# Patient Record
Sex: Female | Born: 1965 | Race: White | Hispanic: No | Marital: Married | Smoking: Never smoker
Health system: Southern US, Community
[De-identification: ages and names within clinical notes are randomized; demographics above are authoritative.]

## PROBLEM LIST (undated history)

## (undated) DIAGNOSIS — K589 Irritable bowel syndrome without diarrhea: Secondary | ICD-10-CM

## (undated) DIAGNOSIS — M720 Palmar fascial fibromatosis [Dupuytren]: Secondary | ICD-10-CM

## (undated) DIAGNOSIS — K219 Gastro-esophageal reflux disease without esophagitis: Secondary | ICD-10-CM

## (undated) HISTORY — PX: DILATION AND CURETTAGE OF UTERUS: SHX78

## (undated) HISTORY — DX: Gastro-esophageal reflux disease without esophagitis: K21.9

## (undated) HISTORY — PX: COLONOSCOPY: SHX174

## (undated) HISTORY — PX: BREAST BIOPSY: SHX20

## (undated) HISTORY — PX: BUNIONECTOMY: SHX129

## (undated) HISTORY — DX: Irritable bowel syndrome, unspecified: K58.9

## (undated) HISTORY — DX: Palmar fascial fibromatosis (dupuytren): M72.0

---

## 1998-08-30 ENCOUNTER — Other Ambulatory Visit: Admission: RE | Admit: 1998-08-30 | Discharge: 1998-08-30 | Payer: Self-pay | Admitting: *Deleted

## 1999-09-07 ENCOUNTER — Other Ambulatory Visit: Admission: RE | Admit: 1999-09-07 | Discharge: 1999-09-07 | Payer: Self-pay | Admitting: *Deleted

## 2001-01-14 ENCOUNTER — Other Ambulatory Visit: Admission: RE | Admit: 2001-01-14 | Discharge: 2001-01-14 | Payer: Self-pay | Admitting: Obstetrics and Gynecology

## 2001-01-20 ENCOUNTER — Ambulatory Visit (HOSPITAL_COMMUNITY): Admission: RE | Admit: 2001-01-20 | Discharge: 2001-01-20 | Payer: Self-pay | Admitting: Obstetrics and Gynecology

## 2001-01-20 ENCOUNTER — Encounter (INDEPENDENT_AMBULATORY_CARE_PROVIDER_SITE_OTHER): Payer: Self-pay | Admitting: Specialist

## 2001-07-17 ENCOUNTER — Other Ambulatory Visit: Admission: RE | Admit: 2001-07-17 | Discharge: 2001-07-17 | Payer: Self-pay | Admitting: Obstetrics and Gynecology

## 2001-07-20 ENCOUNTER — Encounter (INDEPENDENT_AMBULATORY_CARE_PROVIDER_SITE_OTHER): Payer: Self-pay | Admitting: Specialist

## 2001-07-20 ENCOUNTER — Ambulatory Visit (HOSPITAL_COMMUNITY): Admission: AD | Admit: 2001-07-20 | Discharge: 2001-07-20 | Payer: Self-pay | Admitting: Obstetrics and Gynecology

## 2001-07-20 ENCOUNTER — Encounter: Payer: Self-pay | Admitting: *Deleted

## 2002-06-26 ENCOUNTER — Other Ambulatory Visit: Admission: RE | Admit: 2002-06-26 | Discharge: 2002-06-26 | Payer: Self-pay | Admitting: Obstetrics and Gynecology

## 2004-02-08 ENCOUNTER — Other Ambulatory Visit: Admission: RE | Admit: 2004-02-08 | Discharge: 2004-02-08 | Payer: Self-pay | Admitting: *Deleted

## 2004-07-26 ENCOUNTER — Encounter: Payer: Self-pay | Admitting: Internal Medicine

## 2005-08-27 ENCOUNTER — Ambulatory Visit: Payer: Self-pay | Admitting: Internal Medicine

## 2005-09-25 ENCOUNTER — Ambulatory Visit: Payer: Self-pay | Admitting: Internal Medicine

## 2005-10-22 ENCOUNTER — Ambulatory Visit: Payer: Self-pay | Admitting: Internal Medicine

## 2006-05-23 ENCOUNTER — Ambulatory Visit: Payer: Self-pay | Admitting: Internal Medicine

## 2006-05-30 ENCOUNTER — Ambulatory Visit: Payer: Self-pay | Admitting: Internal Medicine

## 2007-12-11 LAB — CONVERTED CEMR LAB: Pap Smear: NORMAL

## 2008-01-26 ENCOUNTER — Ambulatory Visit: Payer: Self-pay | Admitting: Internal Medicine

## 2008-01-27 ENCOUNTER — Ambulatory Visit: Payer: Self-pay | Admitting: Cardiology

## 2008-01-29 ENCOUNTER — Telehealth: Payer: Self-pay | Admitting: Internal Medicine

## 2008-05-20 ENCOUNTER — Ambulatory Visit: Payer: Self-pay | Admitting: Internal Medicine

## 2008-05-20 LAB — CONVERTED CEMR LAB
Albumin: 3.7 g/dL (ref 3.5–5.2)
BUN: 6 mg/dL (ref 6–23)
Bilirubin Urine: NEGATIVE
Calcium: 8.7 mg/dL (ref 8.4–10.5)
Cholesterol: 211 mg/dL (ref 0–200)
Creatinine, Ser: 0.8 mg/dL (ref 0.4–1.2)
Eosinophils Relative: 2.8 % (ref 0.0–5.0)
GFR calc Af Amer: 101 mL/min
Glucose, Bld: 97 mg/dL (ref 70–99)
HCT: 39 % (ref 36.0–46.0)
Hemoglobin: 13.6 g/dL (ref 12.0–15.0)
Monocytes Absolute: 0.5 10*3/uL (ref 0.1–1.0)
Monocytes Relative: 10.4 % (ref 3.0–12.0)
Neutro Abs: 3.1 10*3/uL (ref 1.4–7.7)
Specific Gravity, Urine: 1.02
TSH: 3 microintl units/mL (ref 0.35–5.50)
Total CHOL/HDL Ratio: 4.2
Total Protein: 6.7 g/dL (ref 6.0–8.3)
Urobilinogen, UA: 0.2
WBC: 4.7 10*3/uL (ref 4.5–10.5)

## 2008-05-28 ENCOUNTER — Ambulatory Visit: Payer: Self-pay | Admitting: Internal Medicine

## 2008-06-16 ENCOUNTER — Telehealth: Payer: Self-pay | Admitting: Internal Medicine

## 2008-06-16 ENCOUNTER — Ambulatory Visit: Payer: Self-pay | Admitting: Internal Medicine

## 2008-06-16 DIAGNOSIS — R930 Abnormal findings on diagnostic imaging of skull and head, not elsewhere classified: Secondary | ICD-10-CM

## 2008-06-17 ENCOUNTER — Ambulatory Visit: Payer: Self-pay | Admitting: Internal Medicine

## 2008-06-30 ENCOUNTER — Ambulatory Visit: Payer: Self-pay | Admitting: Internal Medicine

## 2008-10-25 ENCOUNTER — Ambulatory Visit: Payer: Self-pay | Admitting: Internal Medicine

## 2008-10-25 DIAGNOSIS — K589 Irritable bowel syndrome without diarrhea: Secondary | ICD-10-CM

## 2008-11-02 ENCOUNTER — Ambulatory Visit: Payer: Self-pay | Admitting: Internal Medicine

## 2008-11-02 LAB — CONVERTED CEMR LAB
ALT: 16 units/L (ref 0–35)
Albumin: 3.9 g/dL (ref 3.5–5.2)
Alkaline Phosphatase: 42 units/L (ref 39–117)
GFR calc non Af Amer: 98 mL/min
Glucose, Bld: 84 mg/dL (ref 70–99)
HCT: 39.5 % (ref 36.0–46.0)
Lymphocytes Relative: 23.4 % (ref 12.0–46.0)
Monocytes Absolute: 0.6 10*3/uL (ref 0.1–1.0)
Monocytes Relative: 11.9 % (ref 3.0–12.0)
Platelets: 245 10*3/uL (ref 150–400)
Potassium: 3.9 meq/L (ref 3.5–5.1)
RDW: 11.9 % (ref 11.5–14.6)
Sodium: 140 meq/L (ref 135–145)
Total Protein: 7 g/dL (ref 6.0–8.3)

## 2008-11-02 LAB — HM COLONOSCOPY

## 2008-11-11 ENCOUNTER — Ambulatory Visit (HOSPITAL_COMMUNITY): Admission: RE | Admit: 2008-11-11 | Discharge: 2008-11-11 | Payer: Self-pay | Admitting: Internal Medicine

## 2009-02-07 ENCOUNTER — Ambulatory Visit: Payer: Self-pay | Admitting: Internal Medicine

## 2009-02-10 ENCOUNTER — Ambulatory Visit: Payer: Self-pay | Admitting: Internal Medicine

## 2009-02-10 ENCOUNTER — Telehealth (INDEPENDENT_AMBULATORY_CARE_PROVIDER_SITE_OTHER): Payer: Self-pay | Admitting: *Deleted

## 2009-06-01 ENCOUNTER — Encounter: Payer: Self-pay | Admitting: Internal Medicine

## 2009-07-07 IMAGING — US US TRANSVAGINAL NON-OB
1 series · 14 of 25 positions shown · non-contrast
Comparison: None relevant.

Addendum Begins

Impression #2 should read NO suspicious adnexal findings.
Addendum Ends
CLINICAL DATA: Abdominal pelvic bloating, worse with menses.
TRANSABDOMINAL AND TRANSVAGINAL ULTRASOUND OF PELVIS
TECHNIQUE: Both transabdominal and transvaginal ultrasound
examinations of the pelvis were performed including evaluation of
the uterus, ovaries, adnexal regions, and pelvic cul-de-sac.

[Series 1: unknown · 0.32mm/px · 14 of 48 slices shown]
[im 1/48]
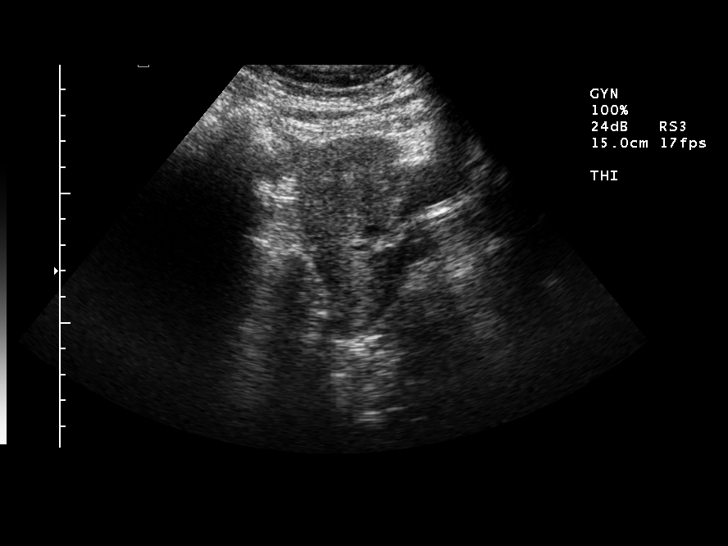
[im 4/48]
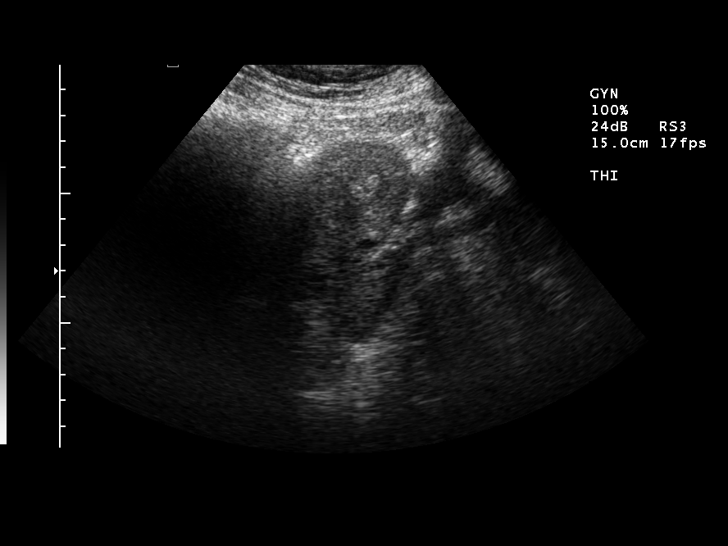
[im 8/48]
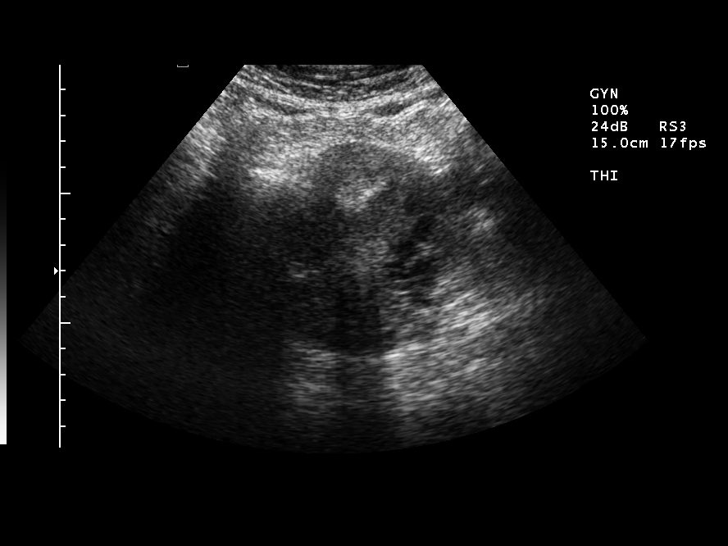
[im 12/48]
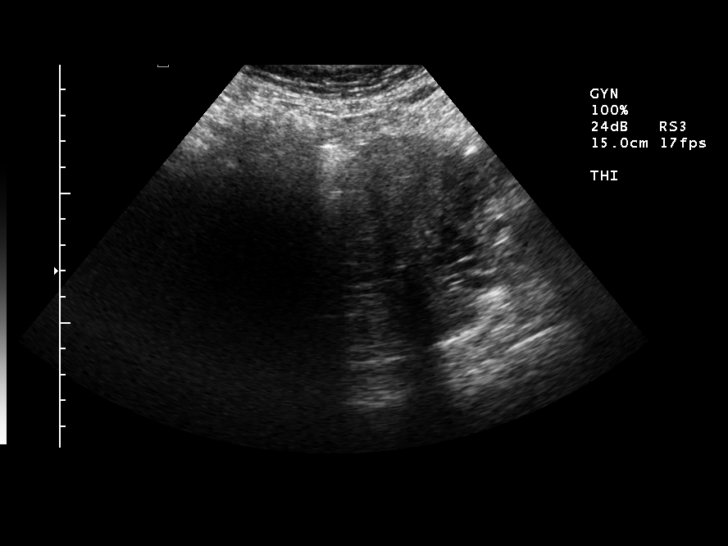
[im 16/48]
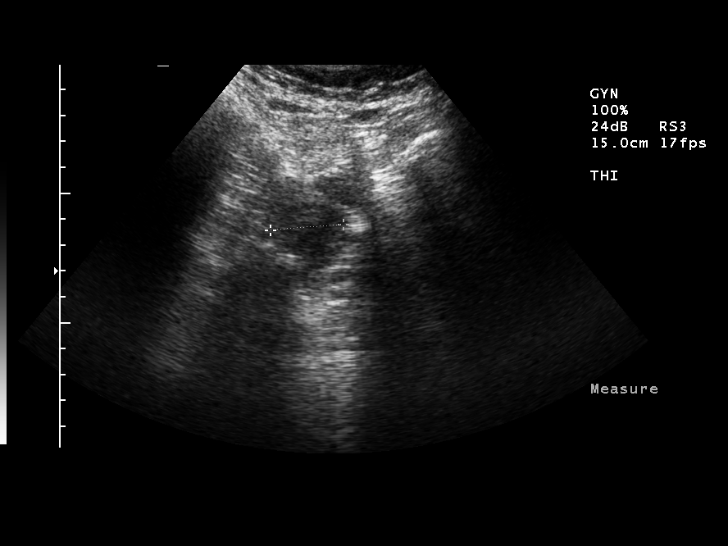
[im 18/48]
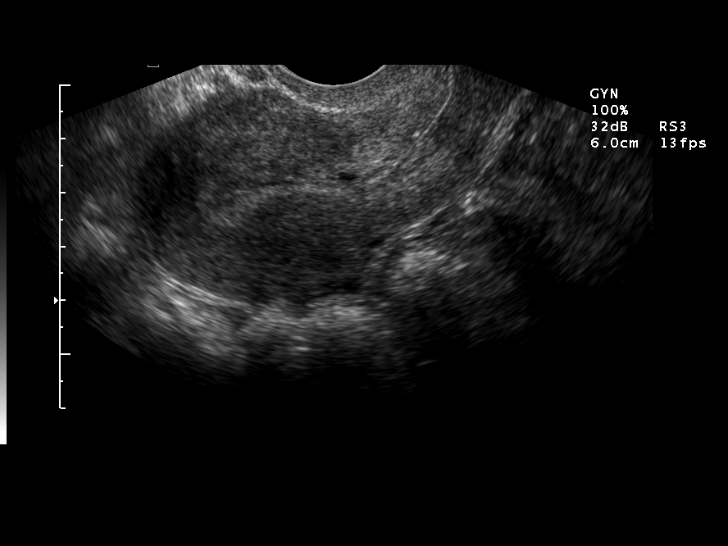
[im 22/48]
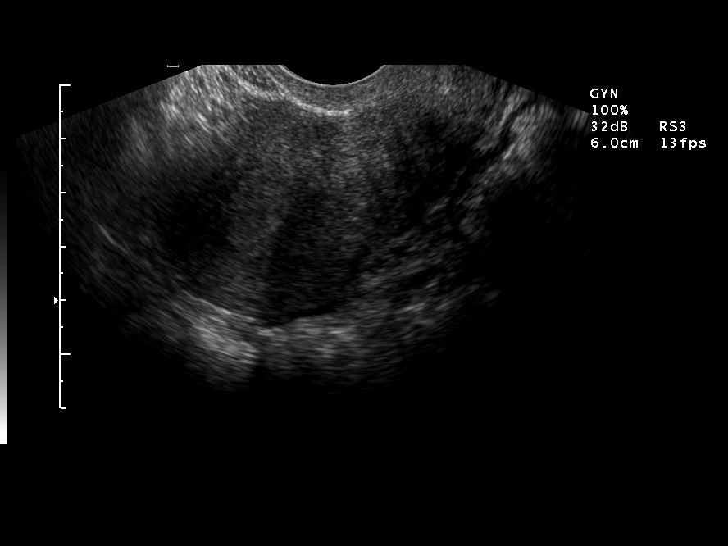
[im 26/48]
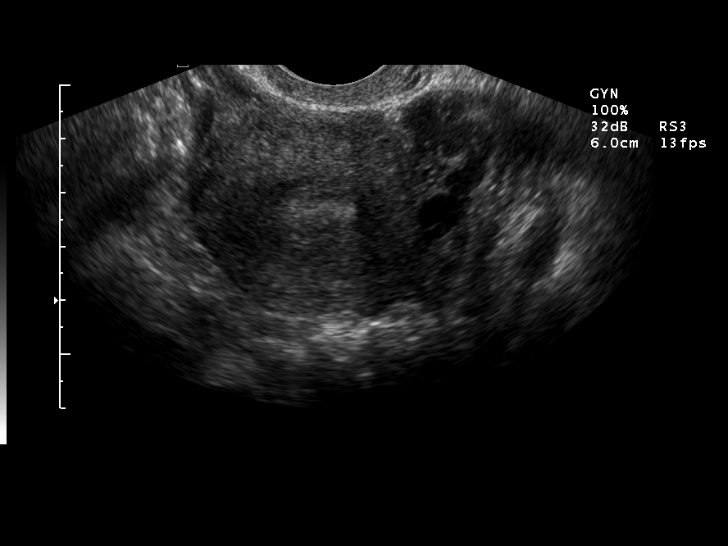
[im 30/48]
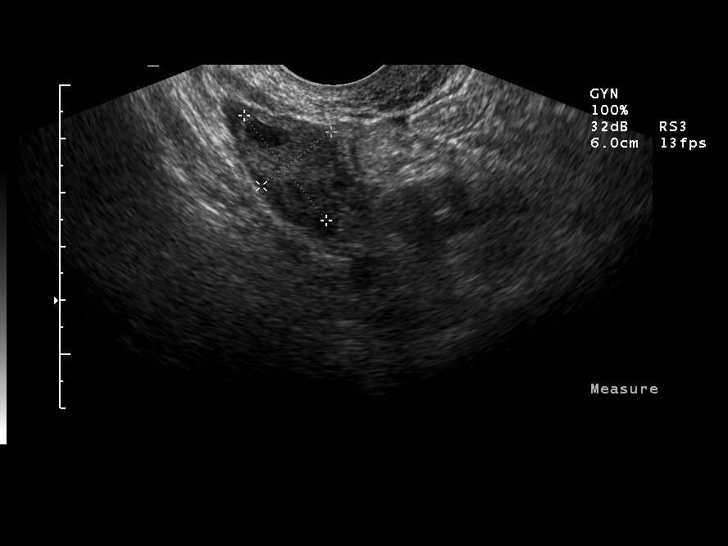
[im 32/48]
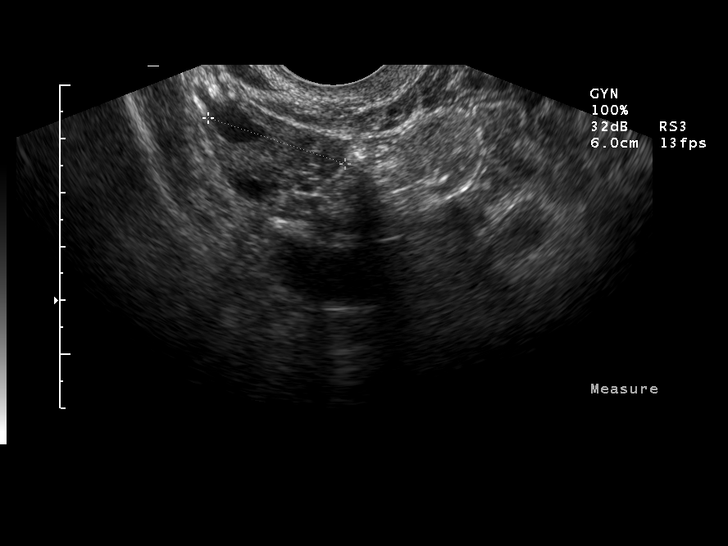
[im 36/48]
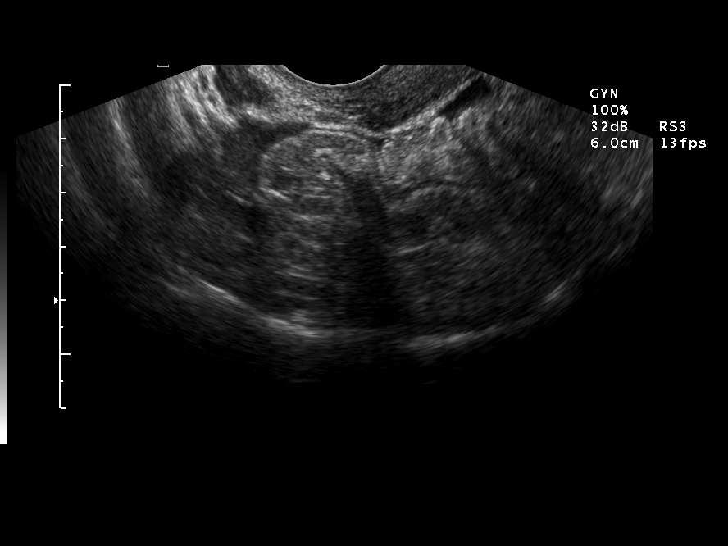
[im 40/48]
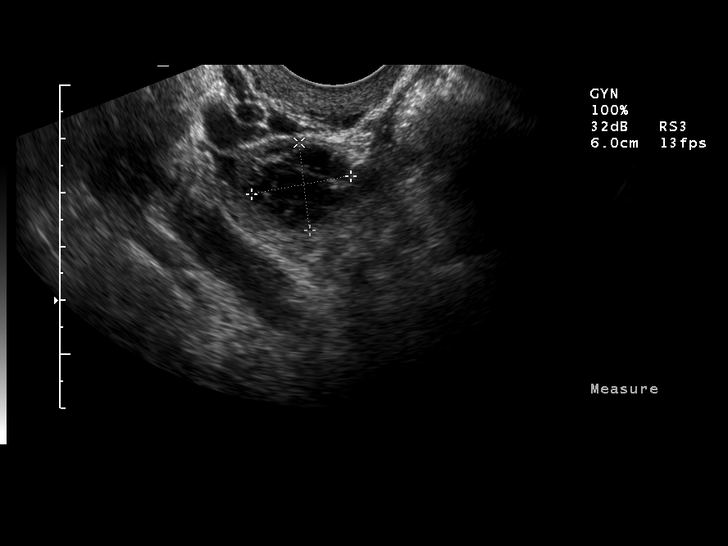
[im 44/48]
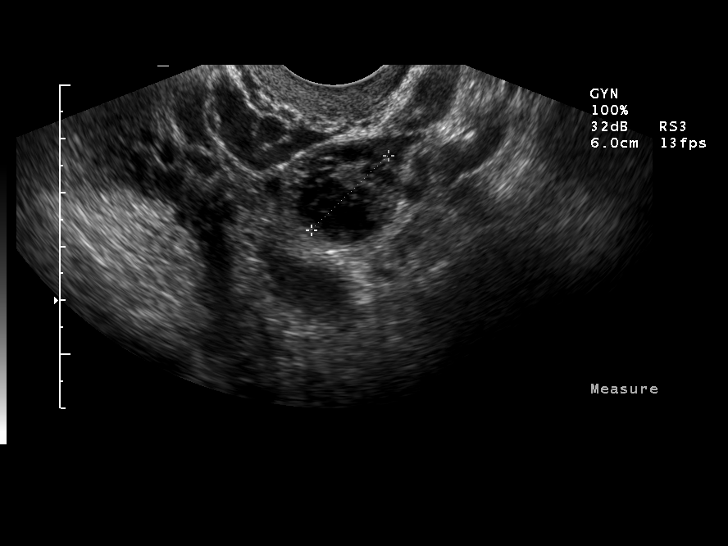
[im 48/48]
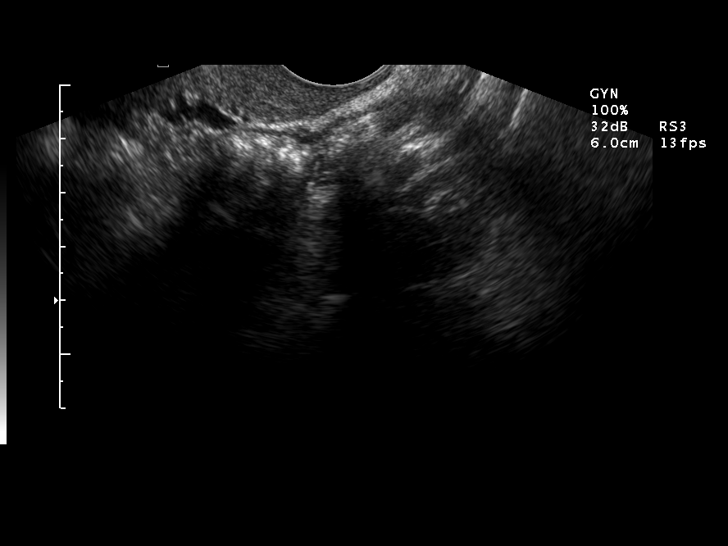

[14 of 25 positions shown; findings below may reference images not displayed]

FINDINGS: The uterus measures 7.8 x 4.3 x 6.4 cm.  The myometrium
is homogeneous.  The endometrium measures 1 cm in thickness.

Both ovaries are normal in size.  The right ovary measures 2.5 x
1.6 x 2.7 cm.  The left ovary measures 2.6 x 2.3 x 3.1 cm.  Within
the left ovary, there is a mildly complex cystic lesion measuring
1.9 x 1.6 x 2.0 cm.  This has an appearance most compatible with a
small hemorrhagic corpus luteal cyst.  There is no free pelvic
fluid.
IMPRESSION: 1.  Probable small left ovarian hemorrhagic corpus luteal cyst
measuring 2.0 cm in greatest dimension.
2.  Suspicious adnexal findings.
3.  The uterus appears normal.

## 2009-08-18 ENCOUNTER — Ambulatory Visit: Payer: Self-pay | Admitting: Internal Medicine

## 2009-08-18 LAB — CONVERTED CEMR LAB
AST: 17 units/L (ref 0–37)
Albumin: 4.1 g/dL (ref 3.5–5.2)
Alkaline Phosphatase: 40 units/L (ref 39–117)
BUN: 10 mg/dL (ref 6–23)
Basophils Absolute: 0 10*3/uL (ref 0.0–0.1)
Bilirubin Urine: NEGATIVE
CO2: 28 meq/L (ref 19–32)
Calcium: 8.8 mg/dL (ref 8.4–10.5)
Cholesterol: 189 mg/dL (ref 0–200)
Eosinophils Absolute: 0.1 10*3/uL (ref 0.0–0.7)
GFR calc non Af Amer: 96.79 mL/min (ref >60)
Glucose, Bld: 90 mg/dL (ref 70–99)
Glucose, Urine, Semiquant: NEGATIVE
HDL: 47.2 mg/dL (ref >39.00)
Ketones, urine, test strip: NEGATIVE
Lymphocytes Relative: 24 % (ref 12.0–46.0)
Lymphs Abs: 1.2 10*3/uL (ref 0.7–4.0)
MCHC: 34.2 g/dL (ref 30.0–36.0)
Monocytes Relative: 9.7 % (ref 3.0–12.0)
Platelets: 228 10*3/uL (ref 150.0–400.0)
Protein, U semiquant: NEGATIVE
RDW: 11.8 % (ref 11.5–14.6)
TSH: 1.72 microintl units/mL (ref 0.35–5.50)
Total Bilirubin: 0.9 mg/dL (ref 0.3–1.2)
Triglycerides: 82 mg/dL (ref 0.0–149.0)
VLDL: 16.4 mg/dL (ref 0.0–40.0)
WBC Urine, dipstick: NEGATIVE
pH: 7

## 2009-08-24 ENCOUNTER — Ambulatory Visit: Payer: Self-pay | Admitting: Internal Medicine

## 2009-09-05 ENCOUNTER — Encounter (INDEPENDENT_AMBULATORY_CARE_PROVIDER_SITE_OTHER): Payer: Self-pay | Admitting: *Deleted

## 2010-06-06 ENCOUNTER — Encounter: Payer: Self-pay | Admitting: Internal Medicine

## 2010-06-29 ENCOUNTER — Ambulatory Visit: Payer: Self-pay | Admitting: Internal Medicine

## 2010-06-29 ENCOUNTER — Telehealth: Payer: Self-pay | Admitting: Internal Medicine

## 2010-06-29 LAB — CONVERTED CEMR LAB
Basophils Relative: 0.3 % (ref 0.0–3.0)
Eosinophils Absolute: 0.1 10*3/uL (ref 0.0–0.7)
Eosinophils Relative: 0.7 % (ref 0.0–5.0)
Hemoglobin: 13.4 g/dL (ref 12.0–15.0)
Lymphocytes Relative: 22.3 % (ref 12.0–46.0)
MCHC: 34.6 g/dL (ref 30.0–36.0)
Neutro Abs: 5.3 10*3/uL (ref 1.4–7.7)
Neutrophils Relative %: 68.7 % (ref 43.0–77.0)
RBC: 4.24 M/uL (ref 3.87–5.11)
WBC: 7.7 10*3/uL (ref 4.5–10.5)

## 2010-08-07 ENCOUNTER — Encounter: Payer: Self-pay | Admitting: Internal Medicine

## 2010-09-05 ENCOUNTER — Ambulatory Visit: Payer: Self-pay | Admitting: Internal Medicine

## 2010-09-05 LAB — CONVERTED CEMR LAB
AST: 17 units/L (ref 0–37)
Alkaline Phosphatase: 41 units/L (ref 39–117)
Basophils Absolute: 0 10*3/uL (ref 0.0–0.1)
Basophils Relative: 0.6 % (ref 0.0–3.0)
Bilirubin Urine: NEGATIVE
Bilirubin, Direct: 0.1 mg/dL (ref 0.0–0.3)
Blood in Urine, dipstick: NEGATIVE
CO2: 29 meq/L (ref 19–32)
Calcium: 8.9 mg/dL (ref 8.4–10.5)
Cholesterol: 221 mg/dL — ABNORMAL HIGH (ref 0–200)
Creatinine, Ser: 0.8 mg/dL (ref 0.4–1.2)
Eosinophils Absolute: 0.1 10*3/uL (ref 0.0–0.7)
GFR calc non Af Amer: 83.78 mL/min (ref >60)
HDL: 51.5 mg/dL (ref >39.00)
Hemoglobin: 13.3 g/dL (ref 12.0–15.0)
Ketones, urine, test strip: NEGATIVE
Lymphocytes Relative: 27.7 % (ref 12.0–46.0)
MCHC: 34.2 g/dL (ref 30.0–36.0)
Monocytes Relative: 11 % (ref 3.0–12.0)
Neutro Abs: 2.9 10*3/uL (ref 1.4–7.7)
Neutrophils Relative %: 57.8 % (ref 43.0–77.0)
Nitrite: NEGATIVE
Protein, U semiquant: NEGATIVE
RBC: 4.24 M/uL (ref 3.87–5.11)
Sodium: 140 meq/L (ref 135–145)
Total CHOL/HDL Ratio: 4
Total Protein: 7.1 g/dL (ref 6.0–8.3)
Triglycerides: 194 mg/dL — ABNORMAL HIGH (ref 0.0–149.0)
Urobilinogen, UA: 0.2
VLDL: 38.8 mg/dL (ref 0.0–40.0)
WBC: 5 10*3/uL (ref 4.5–10.5)

## 2010-09-09 LAB — HM MAMMOGRAPHY: HM Mammogram: NORMAL

## 2010-09-09 LAB — CONVERTED CEMR LAB: Pap Smear: NORMAL

## 2010-09-12 ENCOUNTER — Ambulatory Visit: Payer: Self-pay | Admitting: Internal Medicine

## 2010-09-14 ENCOUNTER — Ambulatory Visit: Payer: Self-pay | Admitting: Internal Medicine

## 2010-09-14 LAB — CONVERTED CEMR LAB: Rhuematoid fact SerPl-aCnc: <20 intl units/mL (ref 0–20)

## 2011-01-07 LAB — CONVERTED CEMR LAB
BUN: 5 mg/dL — ABNORMAL LOW (ref 6–23)
Basophils Absolute: 0 10*3/uL (ref 0.0–0.1)
Calcium: 9 mg/dL (ref 8.4–10.5)
Eosinophils Absolute: 0.1 10*3/uL (ref 0.0–0.6)
Eosinophils Relative: 1.9 % (ref 0.0–5.0)
GFR calc Af Amer: 118 mL/min
GFR calc non Af Amer: 98 mL/min
Glucose, Bld: 102 mg/dL — ABNORMAL HIGH (ref 70–99)
Lymphocytes Relative: 26.5 % (ref 12.0–46.0)
MCHC: 34.1 g/dL (ref 30.0–36.0)
MCV: 89.2 fL (ref 78.0–100.0)
Monocytes Relative: 8.6 % (ref 3.0–11.0)
Neutro Abs: 3.8 10*3/uL (ref 1.4–7.7)
Platelets: 288 10*3/uL (ref 150–400)
TSH: 3.96 microintl units/mL (ref 0.35–5.50)

## 2011-01-09 NOTE — Assessment & Plan Note (Signed)
Summary: abdominal pain, rectal bleeding -rv   Vital Signs:  Patient profile:   45 year old female Weight:      137 pounds BP sitting:   110 / 68  (right arm) Cuff size:   regular  Vitals Entered By: Duard Brady LPN (June 29, 2010 12:47 PM) CC: c/o abd paim , cramps and diarrhea. blood noted in stools   Primary Care Provider:  Dr. Birdie Sons  CC:  c/o abd paim  and cramps and diarrhea. blood noted in stools.  History of Present Illness: 45 year old patient who has a history of IBS.  She also has a history of factor V Leiden abnormality.  Yesterday, she developed some nausea and crampy, abdominal pain.  Last night, she had  an episode of hematochezia associated with some mucous-type discharge.  This occurred after two or 3 diarrheal stools.  Throughout the day she has had small-volume bloody mucoid loose stools.  She continues to have some mild crampy pain and some nausea.  No pertinent travel history, although she does use a community well.  She has constipation prone IBS.  the patient did have a normal colonoscopy in November of 2009  Allergies: 1)  ! Erythromycin Base (Erythromycin Base) 2)  ! * Neumorfen  Past History:  Past Medical History: Reviewed history from 08/24/2009 and no changes required. factor 5 deficiency (miscarriages) GERD Irritable Bowel Syndrome Abn CT chest 01/27/08 = RN changes, mild bronchiectasis LUL     - Baseline CXR 07/12/2004 Subtle RN changes     - Re cxr 02/10/09 no change  Review of Systems       The patient complains of anorexia, abdominal pain, and hematochezia.  The patient denies fever, weight loss, weight gain, vision loss, decreased hearing, hoarseness, chest pain, syncope, dyspnea on exertion, peripheral edema, prolonged cough, headaches, hemoptysis, melena, severe indigestion/heartburn, hematuria, incontinence, genital sores, muscle weakness, suspicious skin lesions, transient blindness, difficulty walking, depression, unusual weight  change, abnormal bleeding, enlarged lymph nodes, angioedema, and breast masses.    Physical Exam  General:  Well-developed,well-nourished,in no acute distress; alert,appropriate and cooperative throughout examination; 120/78 Mouth:  Oral mucosa and oropharynx without lesions or exudates.  Teeth in good repair. Neck:  No deformities, masses, or tenderness noted. Lungs:  Normal respiratory effort, chest expands symmetrically. Lungs are clear to auscultation, no crackles or wheezes. Heart:  Normal rate and regular rhythm. S1 and S2 normal without gallop, murmur, click, rub or other extra sounds.  no tachycardia Abdomen:  active bowel sounds.  No distention or guarding suggestion of some very mild left lower quadrant tenderness Rectal:  no external abnormalities, no hemorrhoids, normal sphincter tone, and no masses.  scanty stool was hematest positive   Impression & Recommendations:  Problem # 1:  HEMATOCHEZIA (ICD-578.1)  possibilities include infectious colitis;  in view of her factor V Leiden deficiency.  Also, ischemic bowel is a consideration; options were discussed.  Her symptoms seem to be improving.  Will check a CBC today and clinically observe off antibiotic therapy.  She will call if there is any clinical worsening such as worsening abdominal pain or bloody diarrhea.  She states as he does not require any analgesics or antiasthmatics  Orders: Venipuncture (51884) TLB-CBC Platelet - w/Differential (85025-[id number]) Specimen Handling (16606)  Complete Medication List: 1)  Aspirin 81 Mg Tabs (Aspirin) .... One daily 2)  Fish Oil 1000 Mg Caps (Omega-3 fatty acids) .... Once daily 3)  Multivitamins Caps (Multiple vitamin) .... Once daily 4)  Calcium  600+d Plus Minerals 600-400 Mg-unit Chew (Calcium carbonate-vit d-min) .... Once daily 5)  Psyllium 58.6 % Powd (Psyllium) .... As directed as needed  Patient Instructions: 1)  call if there is any worsening abdominal pain or bloody  diarrhea 2)  hold  daily, aspirin until bleeding resolves 3)  Drink clear liquids only for the next 24 hours, then slowly add other liquids and food as you  tolerate them.

## 2011-01-09 NOTE — Assessment & Plan Note (Signed)
Summary: CPX // RS   Vital Signs:  Patient profile:   45 year old female Height:      64.75 inches Weight:      139 pounds BMI:     23.39 Temp:     98.8 degrees F oral Pulse rate:   80 / minute Pulse rhythm:   regular Resp:     12 per minute BP sitting:   130 / 90  (left arm) Cuff size:   regular  Vitals Entered By: Sid Falcon LPN (September 12, 2010 8:13 AM)   Primary Care Provider:  Dr. Birdie Sons   History of Present Illness: cpx  Current Problems (verified): 1)  Ibs  (ICD-564.1) 2)  Ct, Chest, Abnormal  (ICD-793.1) 3)  Physical Examination  (ICD-V70.0)  Current Medications (verified): 1)  Aspirin 81 Mg  Tabs (Aspirin) .... One Daily 2)  Fish Oil 1000 Mg Caps (Omega-3 Fatty Acids) .... Once Daily 3)  Multivitamins  Caps (Multiple Vitamin) .... Once Daily 4)  Calcium 600+d Plus Minerals 600-400 Mg-Unit Chew (Calcium Carbonate-Vit D-Min) .... Once Daily 5)  Psyllium 58.6 % Powd (Psyllium) .... As Directed As Needed  Allergies: 1)  ! Erythromycin Base (Erythromycin Base) 2)  ! * Neumorfen  Past History:  Past Medical History: Last updated: 08/24/2009 factor 5 deficiency (miscarriages) GERD Irritable Bowel Syndrome Abn CT chest 01/27/08 = RN changes, mild bronchiectasis LUL     - Baseline CXR 07/12/2004 Subtle RN changes     - Re cxr 02/10/09 no change  Past Surgical History: Last updated: 08/24/2009 breast Bx benign  bunionectomy D and C miscarriage. (2)  Family History: Last updated: 05/26/2008 Father: alive and well with choles, ?DM Mother: HTN, choles, ?DM Siblings: no brothers, 1 sister alive and well Family History Diabetes 1st degree relative Family History High cholesterol Family History Hypertension  Social History: Last updated: 10/25/2008 Occupation:Home maker Married Never Smoked 2 children healthy Alcohol Use - no Daily Caffeine Use -2 Illicit Drug Use - no  Risk Factors: Smoking Status: never (01/26/2008)  Review of  Systems       All other systems reviewed and were negative except new problems of hand pain says hands are achey. Increase pain with gripping. sxs worse in the a.m.---takes an hour until improvement.  left arm---tender and painful over elbow laterally   Impression & Recommendations:  Problem # 1:  PHYSICAL EXAMINATION (ICD-V70.0) health maint UTD lateral epicondylitis hand pain  Complete Medication List: 1)  Aspirin 81 Mg Tabs (Aspirin) .... One daily 2)  Fish Oil 1000 Mg Caps (Omega-3 fatty acids) .... Once daily 3)  Multivitamins Caps (Multiple vitamin) .... Once daily 4)  Calcium 600+d Plus Minerals 600-400 Mg-unit Chew (Calcium carbonate-vit d-min) .... Once daily 5)  Psyllium 58.6 % Powd (Psyllium) .... As directed as needed  Other Orders: Admin 1st Vaccine (16109) Flu Vaccine 52yrs + [phone]) Venipuncture [phone]) T-Antinuclear Antib (ANA) [phone]) T-Rheumatoid Factor [phone]) T-2 View CXR (71020TC)     Flu Vaccine Consent Questions     Do you have a history of severe allergic reactions to this vaccine? no    Any prior history of allergic reactions to egg and/or gelatin? no    Do you have a sensitivity to the preservative Thimersol? no    Do you have a past history of Guillan-Barre Syndrome? no    Do you currently have an acute febrile illness? no    Have you ever had a severe reaction to latex? no  Vaccine information given and explained to patient? yes    Are you currently pregnant? no    Lot Number:[id number]   Exp Date:06/09/2011   Site Given  Left Deltoid IMflu Physical Exam General Appearance: well developed, well nourished, no acute distress Eyes: conjunctiva and lids normal, PERRL, EOMI,  Ears, Nose, Mouth, Throat: TM clear, nares clear, oral exam WNL Neck: supple, no lymphadenopathy, no thyromegaly, no JVD Respiratory: clear to auscultation and percussion, respiratory effort normal Cardiovascular: regular rate and rhythm, S1-S2, no murmur, rub  or gallop, no bruits, peripheral pulses normal and symmetric, no cyanosis, clubbing, edema or varicosities Chest: no scars, masses, tenderness; no asymmetry, skin changes, nipple discharge   Gastrointestinal: soft, non-tender; no hepatosplenomegaly, masses; active bowel sounds all quadrants, Lymphatic: no cervical, axillary or inguinal adenopathy Musculoskeletal: gait normal, muscle tone and strength WNL, no joint swelling, effusions, discoloration, crepitus  Skin: clear, good turgor, color WNL, no rashes, lesions, or ulcerations Neurologic: normal mental status, normal reflexes, normal strength, sensation, and motion Psychiatric: alert; oriented to person, place and time Other Exam:      Preventive Care Screening  Mammogram:    Date:  09/09/2010    Next Due:  09/2012    Results:  normal   Pap Smear:    Date:  09/09/2010    Next Due:  09/2013    Results:  normal    Appended Document: Orders Update    Clinical Lists Changes  Orders: Added new Service order of Specimen Handling ([id number]) - Signed

## 2011-01-09 NOTE — Progress Notes (Signed)
Summary: triage  Phone Note Call from Patient Call back at [phone]   Caller: Patient Call For: Dr. Leone Payor Reason for Call: Talk to Nurse Summary of Call: pt noticed some blood and mucus in her stool during the night last night... not comfortable waiting until next available in September for ov Initial call taken by: Vallarie Mare,  June 29, 2010 8:40 AM  Follow-up for Phone Call        Patient  has a hx of IBS.  She reports last night had sudden lower abdominal cramping and diarrhea.  Multiple episodes overnight of diarrhea.  Now she is passing " bloody jelly mucus", no stool.  She has some minimal cramping, no fever.  She reports "I just don't feel well".  I have advised her to stay on a clear liquid diet for 24 hours, slowly advance as tolerated.  She is  given an appointment for Mike Gip PA on 07/03/10 at 9:30.  I have asked her to contact her primary care today and see if they can see her sooner.  She will call back and cancel the Monday am appointment if it is no longer needed. Follow-up by: Darcey Nora RN, CGRN,  June 29, 2010 10:23 AM

## 2011-04-27 NOTE — Op Note (Signed)
[facility]  Patient:    Caroline Rasmussen, Caroline Rasmussen                    MRN: [id number] Proc. Date: 07/20/01 Attending:  Marina Gravel, M.D.                           Operative Report  PREOPERATIVE DIAGNOSES:       1. Eight week missed abortion.                               2. Factor V positive.  POSTOPERATIVE DIAGNOSES:      1. Eight week missed abortion.                               2. Factor V positive.  PROCEDURE:                    Suction curettage.  SURGEON:                      Marina Gravel, M.D.  ANESTHESIA:                   MAC and 20 cc of 2% lidocaine paracervical block.  SPECIMENS:                    Uterine contents.  COMPLICATIONS:                None.  DESCRIPTION OF PROCEDURE:     The patient was taken to the operating room and MAC anesthesia obtained.  She was placed in the ski position and examined under anesthesia.  She had an 8 to 9-week sized uterus, anterior with no adnexal masses.  She was then prepped and draped in the standard fashion.  THe bladder was emptied with a red rubber catheter.  A speculum was inserted and the cervix visualized.  It was grasped at its anterior lip with a toothed tenaculum.  Paracervical block was instilled in the standard fashion.  The cervix was then easily dilated to a #19.  A #7 curved suction cannula was then inserted and suction obtained.  A moderate amount of products returned. This was continued until no further tissue returned in the suction.  The uterus was then gently curetted and a gritty texture noted throughout.  The suction cannula was passed once again and no further tissue returned. Therefore, the procedure was terminated.  All instruments were removed from the vagina.  The cervix was hemostatic.  The patient tolerated the procedure well and there were no complications. S He was taken to the recovery room awake, alert and in stable condition. DD:  07/20/01 TD:  07/20/01 Job:  [id number]

## 2011-04-27 NOTE — H&P (Signed)
[facility]  Patient:    Caroline Rasmussen, Caroline Rasmussen                    MRN: [id number] Adm. Date:  07/20/01 Attending:  Marina Gravel, M.D.                         History and Physical  PREOPERATIVE DIAGNOSES:       1. Eight-week missed abortion.                               2. History of factor V Leiden.                               3. Recurrent miscarriage.  HISTORY OF PRESENT ILLNESS:   The patient is a 45 year old white female, gravida 6, para 2, now A4, who presents with two-day history of increasing bleeding and known history of factor V Leiden. She has been on Lovenox 40 mg subcutaneous daily. Her last dose was greater than 24 hours ago on the morning of July 19, 2001. She presented today to Maternity Admissions for evaluation of increasing bleeding. She has had no pain.  PAST MEDICAL HISTORY:         Factor V Leiden, otherwise negative.  PAST SURGICAL HISTORY:        D&C x 1.  MEDICATIONS:                  Lovenox 40 mg subcutaneous daily, progesterone suppositories b.i.d., prenatal vitamin daily..  OBSTETRIC HISTORY:            SVD x 2. SAB x3.  FAMILY HISTORY:               Multiple relatives with factor V Leiden.  PHYSICAL EXAMINATION:  VITAL SIGNS:                  Temperature 99.1, pulse 98, respirations 20, blood pressure 129/79.  GENERAL:                      Alert and oriented and anxious but in no acute distress.  HEART:                        Regular rate and rhythm.  LUNGS:                        Clear to auscultation.  ABDOMEN:                      Liver, spleen normal. No hernia.  PELVIC:                       Normal external female genitalia. Vagina with small amount of blood. Cervix is closed. Uterus is approximately 8 to 9 weeks size, anterior, nontender.  ULTRASOUND:                   Ultrasounds performed in radiology which I observed personally and shows a single intrauterine fetus 1.3 cm crown-rump length with  estimated gestational age [redacted] weeks 4 days. There is absent fetal cardiac activity. Also, left corpus luteum cyst is noted.  LABORATORY DATA:  Complete blood count shows white count 5.6, hemoglobin 13.8, platelets 279,000. Review of patients old chart shows her blood type as A positive.  ASSESSMENT:                   1. Missed abortion, 7 weeks 4 days by crown-rump                                  length.                               2. Patient with a history of factor V                                  Leiden and recurrent miscarriage.  PLAN:                         I discussed with patient this was again likely the explanation for todays loss. She is, at this time, greater than 24 hours without Lovenox and has been n.p.o. for three and a half hours. Options discussed including expectant management versus D&C. The patient would like to go ahead with suction curettage today. Operative risks discussed including infection, bleeding, uterine perforation, damage to bowel, bladder, potential need for laparoscopy or laparotomy. All questions were answered and the patient wished to proceed. DD:  07/20/01 TD:  07/20/01 Job: [id number]

## 2011-04-27 NOTE — Op Note (Signed)
[facility]  Patient:    NAZ, DENUNZIO Visit Number: [id number] MRN: [id number]          Service Type: DSU Location: [facility] Attending Physician:  Esmeralda Arthur Proc. Date: 01/20/01 Admit Date:  01/20/2001                             Operative Report  PREOPERATIVE DIAGNOSIS:       Recurrent pregnancy loss with missed abortion.  POSTOPERATIVE DIAGNOSIS:      Recurrent pregnancy loss with missed abortion.  OPERATION:                    D&E.  SURGEON:                      Silverio Lay, M.D.  ASSISTANT:  ANESTHESIA:                   MAC plus paracervical block.  ESTIMATED BLOOD LOSS:         Minimal.  DESCRIPTION OF PROCEDURE:     After being informed of the planned procedure with possible complications including bleeding, infection, uterine perforation with need for laparoscopy or laparotomy, retained products of conception with need for second procedure, informed consent is obtained.  The patient is taken to OR #3 and given IV sedation.  She is placed in the lithotomy position, prepped and draped in a sterile fashion.  GYN examination reveals retroverted uterus compatible with a 10 to 11 weeks pregnancy with two normal adnexae. Weighted speculum is inserted and anterior lip of the cervix is grasped with a tenaculum and uterus is sounded at 9.5 cm.  Using Hegar dilators, cervix is dilated at dilator #33 and using a curved #9 cannula, uterine contents are emptied with suction.  After suction is completed, a sharp curette is used to assess uterine walls which are felt to be free of products including two normal cornua.  Instruments are then removed.  Estimated blood loss is minimal.  Instrument and sponge count is complete x 2.  The patient tolerated the procedure well and is taken to the recovery room in a well and stable condition. Attending Physician:  Esmeralda Arthur DD:  01/20/01 TD:  01/21/01 Job: [id number]

## 2011-06-15 ENCOUNTER — Encounter: Payer: Self-pay | Admitting: Internal Medicine

## 2011-12-27 ENCOUNTER — Encounter: Payer: Self-pay | Admitting: Internal Medicine

## 2011-12-27 ENCOUNTER — Ambulatory Visit (INDEPENDENT_AMBULATORY_CARE_PROVIDER_SITE_OTHER): Payer: BC Managed Care – PPO | Admitting: Internal Medicine

## 2011-12-27 VITALS — BP 124/90 | HR 94 | Temp 98.8°F | Ht 64.5 in | Wt 148.0 lb

## 2011-12-27 DIAGNOSIS — Z0000 Encounter for general adult medical examination without abnormal findings: Secondary | ICD-10-CM

## 2011-12-27 DIAGNOSIS — Z23 Encounter for immunization: Secondary | ICD-10-CM

## 2011-12-27 DIAGNOSIS — D682 Hereditary deficiency of other clotting factors: Secondary | ICD-10-CM

## 2011-12-27 DIAGNOSIS — R5383 Other fatigue: Secondary | ICD-10-CM

## 2011-12-27 LAB — VITAMIN B12: Vitamin B-12: 298 pg/mL (ref 211–911)

## 2011-12-27 LAB — SEDIMENTATION RATE: Sed Rate: 36 mm/hr — ABNORMAL HIGH (ref 0–22)

## 2011-12-27 LAB — TSH: TSH: 3.89 u[IU]/mL (ref 0.35–5.50)

## 2011-12-27 NOTE — Progress Notes (Signed)
Patient ID: Caroline Rasmussen, female   DOB: 08/09/1966, 46 y.o.   MRN: [id number]  cpx  Past Medical History  Diagnosis Date  . GERD (gastroesophageal reflux disease)   . IBS (irritable bowel syndrome)     History   Social History  . Marital Status: Married    Spouse Name: N/A    Number of Children: N/A  . Years of Education: N/A   Occupational History  . Not on file.   Social History Main Topics  . Smoking status: Never Smoker   . Smokeless tobacco: Not on file  . Alcohol Use: No  . Drug Use: No  . Sexually Active:    Other Topics Concern  . Not on file   Social History Narrative  . No narrative on file    Past Surgical History  Procedure Date  . Breast biopsy     benign  . Bunionectomy   . Dilation and curettage of uterus     x2 (miscarriages)    Family History  Problem Relation Age of Onset  . Diabetes Mother   . Hypertension Mother   . Hyperlipidemia Mother   . Diabetes Father   . Hyperlipidemia Father     Allergies  Allergen Reactions  . Erythromycin Base     REACTION: vomiting    No current outpatient prescriptions on file prior to visit.     patient denies chest pain, shortness of breath, orthopnea. Denies lower extremity edema, abdominal pain, change in appetite, change in bowel movements. Patient denies rashes, musculoskeletal complaints. No other specific complaints in a complete review of systems.   BP 124/90  Pulse 94  Temp(Src) 98.8 F (37.1 C) (Oral)  Ht 5' 4.5" (1.638 m)  Wt 148 lb (67.132 kg)  BMI 25.01 kg/m2  Well-developed well-nourished female in no acute distress. HEENT exam atraumatic, normocephalic, extraocular muscles are intact. Neck is supple. No jugular venous distention no thyromegaly. Chest clear to auscultation without increased work of breathing. Cardiac exam S1 and S2 are regular. Abdominal exam active bowel sounds, soft, nontender. Extremities no edema. Neurologic exam she is alert without any motor sensory  deficits. Gait is normal.  Well Visit: Health Maint UTD

## 2012-06-19 ENCOUNTER — Encounter: Payer: Self-pay | Admitting: Internal Medicine

## 2013-04-09 LAB — HM PAP SMEAR

## 2013-04-21 ENCOUNTER — Other Ambulatory Visit (INDEPENDENT_AMBULATORY_CARE_PROVIDER_SITE_OTHER): Payer: BC Managed Care – PPO

## 2013-04-21 DIAGNOSIS — Z0000 Encounter for general adult medical examination without abnormal findings: Secondary | ICD-10-CM

## 2013-04-21 LAB — BASIC METABOLIC PANEL
CO2: 26 mEq/L (ref 19–32)
Calcium: 8.8 mg/dL (ref 8.4–10.5)
Chloride: 105 mEq/L (ref 96–112)
Potassium: 5 mEq/L (ref 3.5–5.1)
Sodium: 137 mEq/L (ref 135–145)

## 2013-04-21 LAB — CBC WITH DIFFERENTIAL/PLATELET
Basophils Relative: 0.4 % (ref 0.0–3.0)
Eosinophils Absolute: 0.2 10*3/uL (ref 0.0–0.7)
HCT: 39.5 % (ref 36.0–46.0)
Hemoglobin: 13.6 g/dL (ref 12.0–15.0)
Lymphocytes Relative: 27 % (ref 12.0–46.0)
Lymphs Abs: 1.5 10*3/uL (ref 0.7–4.0)
MCHC: 34.3 g/dL (ref 30.0–36.0)
MCV: 89.6 fl (ref 78.0–100.0)
Neutro Abs: 3.2 10*3/uL (ref 1.4–7.7)
RBC: 4.41 Mil/uL (ref 3.87–5.11)

## 2013-04-21 LAB — POCT URINALYSIS DIPSTICK
Protein, UA: NEGATIVE
Spec Grav, UA: 1.01
Urobilinogen, UA: 0.2

## 2013-04-21 LAB — HEPATIC FUNCTION PANEL
Albumin: 3.9 g/dL (ref 3.5–5.2)
Alkaline Phosphatase: 41 U/L (ref 39–117)
Total Protein: 7.1 g/dL (ref 6.0–8.3)

## 2013-04-21 LAB — LDL CHOLESTEROL, DIRECT: Direct LDL: 134.2 mg/dL

## 2013-04-21 LAB — LIPID PANEL
Cholesterol: 204 mg/dL — ABNORMAL HIGH (ref 0–200)
Total CHOL/HDL Ratio: 4

## 2013-04-27 ENCOUNTER — Ambulatory Visit (INDEPENDENT_AMBULATORY_CARE_PROVIDER_SITE_OTHER): Payer: BC Managed Care – PPO | Admitting: Internal Medicine

## 2013-04-27 ENCOUNTER — Encounter: Payer: Self-pay | Admitting: Internal Medicine

## 2013-04-27 VITALS — BP 132/80 | HR 68 | Temp 98.5°F | Ht 65.0 in | Wt 149.0 lb

## 2013-04-27 DIAGNOSIS — Z0000 Encounter for general adult medical examination without abnormal findings: Secondary | ICD-10-CM

## 2013-04-27 NOTE — Progress Notes (Signed)
Patient ID: Caroline Rasmussen, female   DOB: 1966-05-05, 47 y.o.   MRN: [id number]  cpx  Past Medical History  Diagnosis Date  . GERD (gastroesophageal reflux disease)   . IBS (irritable bowel syndrome)     History   Social History  . Marital Status: Married    Spouse Name: N/A    Number of Children: N/A  . Years of Education: N/A   Occupational History  . Not on file.   Social History Main Topics  . Smoking status: Never Smoker   . Smokeless tobacco: Not on file  . Alcohol Use: No  . Drug Use: No  . Sexually Active:    Other Topics Concern  . Not on file   Social History Narrative  . No narrative on file    Past Surgical History  Procedure Laterality Date  . Breast biopsy      benign  . Bunionectomy    . Dilation and curettage of uterus      x2 (miscarriages)    Family History  Problem Relation Age of Onset  . Diabetes Mother   . Hypertension Mother   . Hyperlipidemia Mother   . Diabetes Father   . Hyperlipidemia Father     Allergies  Allergen Reactions  . Erythromycin Base     REACTION: vomiting    Current Outpatient Prescriptions on File Prior to Visit  Medication Sig Dispense Refill  . aspirin 81 MG tablet Take 81 mg by mouth daily.      . Calcium Carbonate-Vitamin D (CALCIUM 600+D) 600-200 MG-UNIT TABS Take 1 tablet by mouth daily.      . Cholecalciferol (VITAMIN D) 2000 UNITS tablet Take 2,000 Units by mouth daily.      . fish oil-omega-3 fatty acids 1000 MG capsule Take 2 g by mouth daily.      . Multiple Vitamin (MULTIVITAMIN) tablet Take 1 tablet by mouth daily.      . psyllium (METAMUCIL) 58.6 % packet Take 1 packet by mouth daily.       No current facility-administered medications on file prior to visit.     patient denies chest pain, shortness of breath, orthopnea. Denies lower extremity edema, abdominal pain, change in appetite, change in bowel movements. Patient denies rashes, musculoskeletal complaints. No other specific complaints  in a complete review of systems.   BP 132/80  Pulse 68  Temp(Src) 98.5 F (36.9 C) (Oral)  Ht 5\' 5"  (1.651 m)  Wt 149 lb (67.586 kg)  BMI 24.79 kg/m2   Well-developed well-nourished female in no acute distress. HEENT exam atraumatic, normocephalic, extraocular muscles are intact. Neck is supple. No jugular venous distention no thyromegaly. Chest clear to auscultation without increased work of breathing. Cardiac exam S1 and S2 are regular. Abdominal exam active bowel sounds, soft, nontender. Extremities no edema. Neurologic exam she is alert without any motor sensory deficits. Gait is normal.  A/p-- well visit Health maint utd.

## 2013-08-28 ENCOUNTER — Encounter: Payer: Self-pay | Admitting: Internal Medicine

## 2013-10-15 ENCOUNTER — Other Ambulatory Visit: Payer: Self-pay

## 2014-01-13 ENCOUNTER — Encounter: Payer: Self-pay | Admitting: Internal Medicine

## 2014-01-13 ENCOUNTER — Ambulatory Visit (INDEPENDENT_AMBULATORY_CARE_PROVIDER_SITE_OTHER): Payer: BC Managed Care – PPO | Admitting: Internal Medicine

## 2014-01-13 VITALS — BP 130/80 | HR 64 | Temp 99.1°F | Resp 18 | Ht 65.0 in | Wt 145.0 lb

## 2014-01-13 DIAGNOSIS — F988 Other specified behavioral and emotional disorders with onset usually occurring in childhood and adolescence: Secondary | ICD-10-CM

## 2014-01-13 MED ORDER — AMPHETAMINE-DEXTROAMPHET ER 20 MG PO CP24: 20.0000 mg | ORAL_CAPSULE | ORAL | Status: DC

## 2014-01-13 MED ORDER — AMPHETAMINE-DEXTROAMPHETAMINE 10 MG PO TABS: 10.0000 mg | ORAL_TABLET | Freq: Two times a day (BID) | ORAL | Status: DC

## 2014-01-13 NOTE — Progress Notes (Signed)
Subjective:    Patient ID: Caroline Rasmussen, female    DOB: 08/01/1966, [age] y.o.   MRN: [id number]  HPI   48 year old patient who is seen today concerns about ADD.  She has a daughter with ADD who has had a very nice clinical response to Concerta. The patient has had difficulty with the focusing and completing tasks her entire life. She had difficulties in middle school and took a number of years to complete college. At the present time she is back in school trying to get a masters in accounting and is having a very difficult time completing homework and performing well academically. 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 GERD (gastroesophageal reflux disease)   . IBS (irritable bowel syndrome)     History   Social History  . Marital Status: Married    Spouse Name: N/A    Number of Children: N/A  . Years of Education: N/A   Occupational History  . Not on file.   Social History Main Topics  . Smoking status: Never Smoker   . Smokeless tobacco: Not on file  . Alcohol Use: 0.6 oz/week    1 Glasses of wine per week  . Drug Use: No  . Sexual Activity: Not on file   Other Topics Concern  . Not on file   Social History Narrative  . No narrative on file    Past Surgical History  Procedure Laterality Date  . Breast biopsy      benign  . Bunionectomy    . Dilation and curettage of uterus      x2 (miscarriages)    Family History  Problem Relation Age of Onset  . Hypertension Mother   . Hyperlipidemia Mother   . Diabetes Father   . Hyperlipidemia Father   . Hypertension Father     Allergies  Allergen Reactions  . Erythromycin Base     REACTION: vomiting    Current Outpatient Prescriptions on File Prior to Visit  Medication Sig Dispense Refill  . aspirin 81 MG tablet Take 81 mg by mouth daily.      . Calcium Carbonate-Vitamin D (CALCIUM 600+D) 600-200 MG-UNIT TABS  Take 1 tablet by mouth daily.      . Cholecalciferol (VITAMIN D) 2000 UNITS tablet Take 2,000 Units by mouth daily.      . fish oil-omega-3 fatty acids 1000 MG capsule Take 2 g by mouth daily.      . magnesium 30 MG tablet Take 30 mg by mouth daily.      . Multiple Vitamin (MULTIVITAMIN) tablet Take 1 tablet by mouth daily.      . psyllium (METAMUCIL) 58.6 % packet Take 1 packet by mouth daily.       No current facility-administered medications on file prior to visit.    BP 130/80  Pulse 64  Temp(Src) 99.1 F (37.3 C) (Oral)  Resp 18  Ht 5\' 5"  (1.651 m)  Wt 145 lb (65.772 kg)  BMI 24.13 kg/m2  LMP 12/30/2013       Review of Systems  Psychiatric/Behavioral: Positive for decreased concentration.       Objective:   Physical Exam  Constitutional: She appears well-developed and well-nourished. No distress.  Psychiatric: She has a normal mood and affect. Her behavior is normal. Judgment and thought content normal.          Assessment & Plan:  Probable ADD. Information was dispensed. The patient has been asked to return in 6 weeks for a followup. We'll start on Adderall ER 20 mg daily and will supplement with short acting Adderall if needed.

## 2014-01-13 NOTE — Patient Instructions (Signed)
Amphetamine; Dextroamphetamine extended-release capsules What is this medicine? AMPHETAMINE; DEXTROAMPHETAMINE (am FET a meen; dex troe am FET a meen) is used to treat attention-deficit hyperactivity disorder (ADHD). Federal law prohibits giving this medicine to any person other than the person for whom it was prescribed. Do not share this medicine with anyone else. This medicine may be used for other purposes; ask your health care provider or pharmacist if you have questions. COMMON BRAND NAME(S): Adderall XR What should I tell my health care provider before I take this medicine? They need to know if you have any of these conditions: -anxiety or panic attacks -circulation problems in fingers and toes -glaucoma -hardening or blockages of the arteries or heart blood vessels -heart disease or a heart defect -high blood pressure -history of a drug or alcohol abuse problem -history of stroke -kidney disease -liver disease -mental illness -seizures -suicidal thoughts, plans, or attempt; a previous suicide attempt by you or a family member -thyroid disease -Tourette's syndrome -an unusual or allergic reaction to dextroamphetamine, other amphetamines, other medicines, foods, dyes, or preservatives -pregnant or trying to get pregnant -breast-feeding How should I use this medicine? Take this medicine by mouth with a glass of water. Follow the directions on the prescription label. This medicine is taken just one time per day, usually in the morning after waking up. Take with or without food. Do not chew or crush this medicine. You may open the capsules and sprinkle the medicine onto a spoon of applesauce. If sprinkled on applesauce, take the dose immediately and do not crush or chew. Always drink a glass of water or other liquid after taking this medicine. Do not take your medicine more often than directed. A special MedGuide will be given to you by the pharmacist with each prescription and refill.  Be sure to read this information carefully each time. Talk to your pediatrician regarding the use of this medicine in children. While this drug may be prescribed for children as young as 6 years for selected conditions, precautions do apply. Overdosage: If you think you have taken too much of this medicine contact a poison control center or emergency room at once. NOTE: This medicine is only for you. Do not share this medicine with others. What if I miss a dose? If you miss a dose, take it as soon as you can. If it is almost time for your next dose, take only that dose. Do not take double or extra doses. What may interact with this medicine? Do not take this medicine with any of the following medications: -certain medicines for migraine headache like almotriptan, eletriptan, frovatriptan, naratriptan, rizatriptan, sumatriptan, zolmitriptan -MAOIs like Carbex, Eldepryl, Marplan, Nardil, and Parnate -melatonin -meperidine -other stimulant medicines for attention disorders, weight loss, or to stay awake -pimozide -procarbazine This medicine may also interact with the following medications: -acetazolamide -ammonium chloride -antacids -ascorbic acid -atomoxetine -caffeine -certain medicines for blood pressure -certain medicines for depression, anxiety, or psychotic disturbances -certain medicines for diabetes -certain medicines for seizures like carbamazepine, phenobarbital, phenytoin -certain medicines for stomach problems like cimetidine, famotidine, omeprazole, lansoprazole -cold or allergy medicines -glutamic acid -methenamine -narcotic medicines for pain -norepinephrine -phenothiazines like chlorpromazine, mesoridazine, prochlorperazine, thioridazine -sodium acid phosphate -sodium bicarbonate This list may not describe all possible interactions. Give your health care provider a list of all the medicines, herbs, non-prescription drugs, or dietary supplements you use. Also tell them  if you smoke, drink alcohol, or use illegal drugs. Some items may interact with your medicine. What   should I watch for while using this medicine? Visit your doctor or health care professional for regular checks on your progress. This prescription requires that you follow special procedures with your doctor and pharmacy. You will need to have a new written prescription from your doctor every time you need a refill. This medicine may affect your concentration, or hide signs of tiredness. Until you know how this medicine affects you, do not drive, ride a bicycle, use machinery, or do anything that needs mental alertness. Tell your doctor or health care professional if this medicine loses its effects, or if you feel you need to take more than the prescribed amount. Do not change the dosage without talking to your doctor or health care professional. Decreased appetite is a common side effect when starting this medicine. Eating small, frequent meals or snacks can help. Talk to your doctor if you continue to have poor eating habits. Height and weight growth of a child taking this medicine will be monitored closely. Do not take this medicine close to bedtime. It may prevent you from sleeping. If you are going to need surgery, an MRI, a CT scan, or other procedure, tell your doctor that you are taking this medicine. You may need to stop taking this medicine before the procedure. Tell your doctor or healthcare professional right away if you notice unexplained wounds on your fingers and toes while taking this medicine. You should also tell your healthcare provider if you experience numbness or pain, changes in the skin color, or sensitivity to temperature in your fingers or toes. What side effects may I notice from receiving this medicine? Side effects that you should report to your doctor or health care professional as soon as possible: -allergic reactions like skin rash, itching or hives, swelling of the face,  lips, or tongue -changes in vision -chest pain or chest tightness -confusion, trouble speaking or understanding -fast, irregular heartbeat -fingers or toes feel numb, cool, painful -hallucination, loss of contact with reality -high blood pressure -males: prolonged or painful erection -seizures -severe headaches -shortness of breath -suicidal thoughts or other mood changes -trouble walking, dizziness, loss of balance or coordination -uncontrollable head, mouth, neck, arm, or leg movements  Side effects that usually do not require medical attention (report to your doctor or health care professional if they continue or are bothersome): -anxious -headache -loss of appetite -nausea, vomiting -trouble sleeping -weight loss This list may not describe all possible side effects. Call your doctor for medical advice about side effects. You may report side effects to FDA at 1-800-FDA-1088. Where should I keep my medicine? Keep out of the reach of children. This medicine can be abused. Keep your medicine in a safe place to protect it from theft. Do not share this medicine with anyone. Selling or giving away this medicine is dangerous and against the law. Store at room temperature between 15 and 30 degrees C (59 and 86 degrees F). Keep container tightly closed. Protect from light. Throw away any unused medicine after the expiration date. NOTE: This sheet is a summary. It may not cover all possible information. If you have questions about this medicine, talk to your doctor, pharmacist, or health care provider.  2014, Elsevier/Gold Standard. (2013-08-17 13:36:47) Attention Deficit Hyperactivity Disorder Attention deficit hyperactivity disorder (ADHD) is a problem with behavior issues based on the way the brain functions (neurobehavioral disorder). It is a common reason for behavior and academic problems in school. SYMPTOMS  There are 3 types of ADHD. The 3  types and some of the symptoms  include:  Inattentive  Gets bored or distracted easily.  Loses or forgets things. Forgets to hand in homework.  Has trouble organizing or completing tasks.  Difficulty staying on task.  An inability to organize daily tasks and school work.  Leaving projects, chores, or homework unfinished.  Trouble paying attention or responding to details. Careless mistakes.  Difficulty following directions. Often seems like is not listening.  Dislikes activities that require sustained attention (like chores or homework).  Hyperactive-impulsive  Feels like it is impossible to sit still or stay in a seat. Fidgeting with hands and feet.  Trouble waiting turn.  Talking too much or out of turn. Interruptive.  Speaks or acts impulsively.  Aggressive, disruptive behavior.  Constantly busy or on the go, noisy.  Often leaves seat when they are expected to remain seated.  Often runs or climbs where it is not appropriate, or feels very restless.  Combined  Has symptoms of both of the above. Often children with ADHD feel discouraged about themselves and with school. They often perform well below their abilities in school. As children get older, the excess motor activities can calm down, but the problems with paying attention and staying organized persist. Most children do not outgrow ADHD but with good treatment can learn to cope with the symptoms. DIAGNOSIS  When ADHD is suspected, the diagnosis should be made by professionals trained in ADHD. This professional will collect information about the individual suspected of having ADHD. Information must be collected from various settings where the person lives, works, or attends school.  Diagnosis will include:  Confirming symptoms began in childhood.  Ruling out other reasons for the child's behavior.  The health care providers will check with the child's school and check their medical records.  They will talk to teachers and  parents.  Behavior rating scales for the child will be filled out by those dealing with the child on a daily basis. A diagnosis is made only after all information has been considered. TREATMENT  Treatment usually includes behavioral treatment, tutoring or extra support in school, and stimulant medicines. Because of the way a person's brain works with ADHD, these medicines decrease impulsivity and hyperactivity and increase attention. This is different than how they would work in a person who does not have ADHD. Other medicines used include antidepressants and certain blood pressure medicines. Most experts agree that treatment for ADHD should address all aspects of the person's functioning. Along with medicines, treatment should include structured classroom management at school. Parents should reward good behavior, provide constant discipline, and limit-setting. Tutoring should be available for the child as needed. ADHD is a life-long condition. If untreated, the disorder can have long-term serious effects into adolescence and adulthood. HOME CARE INSTRUCTIONS   Often with ADHD there is a lot of frustration among family members dealing with the condition. Blame and anger are also feelings that are common. In many cases, because the problem affects the family as a whole, the entire family may need help. A therapist can help the family find better ways to handle the disruptive behaviors of the person with ADHD and promote change. If the person with ADHD is young, most of the therapist's work is with the parents. Parents will learn techniques for coping with and improving their child's behavior. Sometimes only the child with the ADHD needs counseling. Your health care providers can help you make these decisions.  Children with ADHD may need help learning how to  organize. Some helpful tips include:  Keep routines the same every day from wake-up time to bedtime. Schedule all activities, including homework  and playtime. Keep the schedule in a place where the person with ADHD will often see it. Mark schedule changes as far in advance as possible.  Schedule outdoor and indoor recreation.  Have a place for everything and keep everything in its place. This includes clothing, backpacks, and school supplies.  Encourage writing down assignments and bringing home needed books. Work with your child's teachers for assistance in organizing school work.  Offer your child a well-balanced diet. Breakfast that includes a balance of whole grains, protein and, fruits or vegetables is especially important for school performance. Children should avoid drinks with caffeine including:  Soft drinks.  Coffee.  Tea.  However, some older children (adolescents) may find these drinks helpful in improving their attention. Because it can also be common for adolescents with ADHD to become addicted to caffeine, talk with your health care provider about what is a safe amount of caffeine intake for your child.  Children with ADHD need consistent rules that they can understand and follow. If rules are followed, give small rewards. Children with ADHD often receive, and expect, criticism. Look for good behavior and praise it. Set realistic goals. Give clear instructions. Look for activities that can foster success and self-esteem. Make time for pleasant activities with your child. Give lots of affection.  Parents are their children's greatest advocates. Learn as much as possible about ADHD. This helps you become a stronger and better advocate for your child. It also helps you educate your child's teachers and instructors if they feel inadequate in these areas. Parent support groups are often helpful. A national group with local chapters is called Children and Adults with Attention Deficit Hyperactivity Disorder (CHADD). SEEK MEDICAL CARE IF:  Your child has repeated muscle twitches, cough or speech outbursts.  Your child has  sleep problems.  Your child has a marked loss of appetite.  Your child develops depression.  Your child has new or worsening behavioral problems.  Your child develops dizziness.  Your child has a racing heart.  Your child has stomach pains.  Your child develops headaches. SEEK IMMEDIATE MEDICAL CARE IF:  Your child has been diagnosed with depression or anxiety and the symptoms seem to be getting worse.  Your child has been depressed and suddenly appears to have increased energy or motivation.  You are worried that your child is having a bad reaction to a medication he or she is taking for ADHD. Document Released: 11/16/2002 Document Revised: 09/16/2013 Document Reviewed: 08/03/2013 [facility] Patient Information 2014 [place], [place].

## 2014-01-13 NOTE — Progress Notes (Signed)
Pre-visit discussion using our clinic review tool. No additional management support is needed unless otherwise documented below in the visit note.  

## 2014-03-05 ENCOUNTER — Encounter: Payer: Self-pay | Admitting: Internal Medicine

## 2014-03-05 ENCOUNTER — Ambulatory Visit (INDEPENDENT_AMBULATORY_CARE_PROVIDER_SITE_OTHER): Payer: BC Managed Care – PPO | Admitting: Internal Medicine

## 2014-03-05 VITALS — BP 122/80 | HR 76 | Temp 97.9°F | Ht 65.0 in | Wt 142.0 lb

## 2014-03-05 DIAGNOSIS — F909 Attention-deficit hyperactivity disorder, unspecified type: Secondary | ICD-10-CM

## 2014-03-05 NOTE — Progress Notes (Signed)
Pre visit review using our clinic review tool, if applicable. No additional management support is needed unless otherwise documented below in the visit note. 

## 2014-03-05 NOTE — Progress Notes (Signed)
?  ADHD- using adderall with some results Occasionally feels jittery.   Her complaint is that she feels as if she has to study more and harder than he r younger calssmates  Past Medical History  Diagnosis Date  . GERD (gastroesophageal reflux disease)   . IBS (irritable bowel syndrome)     History   Social History  . Marital Status: Married    Spouse Name: N/A    Number of Children: N/A  . Years of Education: N/A   Occupational History  . Not on file.   Social History Main Topics  . Smoking status: Never Smoker   . Smokeless tobacco: Not on file  . Alcohol Use: 0.6 oz/week    1 Glasses of wine per week  . Drug Use: No  . Sexual Activity: Not on file   Other Topics Concern  . Not on file   Social History Narrative  . No narrative on file    Past Surgical History  Procedure Laterality Date  . Breast biopsy      benign  . Bunionectomy    . Dilation and curettage of uterus      x2 (miscarriages)    Family History  Problem Relation Age of Onset  . Hypertension Mother   . Hyperlipidemia Mother   . Diabetes Father   . Hyperlipidemia Father   . Hypertension Father     Allergies  Allergen Reactions  . Erythromycin Base     REACTION: vomiting    Current Outpatient Prescriptions on File Prior to Visit  Medication Sig Dispense Refill  . amphetamine-dextroamphetamine (ADDERALL XR) 20 MG 24 hr capsule Take 1 capsule (20 mg total) by mouth every morning.  30 capsule  0  . aspirin 81 MG tablet Take 81 mg by mouth daily.      . Calcium Carbonate-Vitamin D (CALCIUM 600+D) 600-200 MG-UNIT TABS Take 1 tablet by mouth daily.      . Cholecalciferol (VITAMIN D) 2000 UNITS tablet Take 2,000 Units by mouth daily.      . fish oil-omega-3 fatty acids 1000 MG capsule Take 2 g by mouth daily.      . magnesium 30 MG tablet Take 30 mg by mouth daily.      . Multiple Vitamin (MULTIVITAMIN) tablet Take 1 tablet by mouth daily.      . psyllium (METAMUCIL) 58.6 % packet Take 1 packet  by mouth daily.       No current facility-administered medications on file prior to visit.     patient denies chest pain, shortness of breath, orthopnea. Denies lower extremity edema, abdominal pain, change in appetite, change in bowel movements. Patient denies rashes, musculoskeletal complaints. No other specific complaints in a complete review of systems.   BP 122/80  Pulse 76  Temp(Src) 97.9 F (36.6 C) (Oral)  Ht 5\' 5"  (1.651 m)  Wt 142 lb (64.411 kg)  BMI 23.63 kg/m2 nad chst cta cv reg rate Neuro- alert  A/P- ???ADHD i'm not at all convinced she has this as a real diagnoss She needs to have neuropsych testing

## 2014-03-08 ENCOUNTER — Encounter: Payer: Self-pay | Admitting: Internal Medicine

## 2014-06-10 ENCOUNTER — Other Ambulatory Visit: Payer: Self-pay | Admitting: Internal Medicine

## 2014-06-14 ENCOUNTER — Other Ambulatory Visit: Payer: Self-pay | Admitting: *Deleted

## 2014-06-14 MED ORDER — AMPHETAMINE-DEXTROAMPHET ER 20 MG PO CP24: 20.0000 mg | ORAL_CAPSULE | ORAL | Status: DC

## 2014-06-14 MED ORDER — AMPHETAMINE-DEXTROAMPHET ER 20 MG PO CP24: 20.0000 mg | ORAL_CAPSULE | Freq: Every day | ORAL | Status: DC

## 2014-06-14 NOTE — Telephone Encounter (Signed)
Pt notified Rx's ready for pickup. Rx's printed and signed.  

## 2014-08-31 ENCOUNTER — Encounter: Payer: Self-pay | Admitting: Internal Medicine

## 2014-09-01 ENCOUNTER — Encounter: Payer: Self-pay | Admitting: Physician Assistant

## 2014-09-01 ENCOUNTER — Ambulatory Visit (INDEPENDENT_AMBULATORY_CARE_PROVIDER_SITE_OTHER): Payer: BC Managed Care – PPO | Admitting: Physician Assistant

## 2014-09-01 VITALS — BP 110/70 | HR 72 | Temp 98.1°F | Resp 18 | Wt 138.1 lb

## 2014-09-01 DIAGNOSIS — R232 Flushing: Secondary | ICD-10-CM

## 2014-09-01 MED ORDER — METRONIDAZOLE 0.75 % EX CREA: TOPICAL_CREAM | Freq: Two times a day (BID) | CUTANEOUS | Status: DC

## 2014-09-01 NOTE — Patient Instructions (Addendum)
Trial of topical Metronidazole gel. As directed, thin film to affected areas 1 to 2 times daily after washing face.  If emergency symptoms discussed during visit developed, seek medical attention immediately.  Followup as needed, or for worsening or persistent symptoms despite treatment.    Rosacea Rosacea is a long-term (chronic) skin problem that causes redness and bumps. Rosacea often affects the skin of the face (cheeks, nose, brow, and chin) and sometimes the eyes. Without treatment, rosacea tends to get worse over time. There is no cure for rosacea, but treatment can help control the redness. HOME CARE  Avoid things that seem to make your skin problems worse (triggers).  If you are given antibiotic medicines, take them as told. Finish the medicines even if you start to feel better.  Use a gentle face wash that does not contain alcohol.  You may use a gentle face lotion (moisturizer).  Use a sunscreen or sunblock with SPF 30 or more.  Wear green-tinted makeup to hide redness, if needed. Choose makeup that does not clog your pores (noncomedogenic).  If your eyelids are affected, put a warm washcloth on your eyelids. Do this up to 4 times a day or as told by your doctor. GET HELP RIGHT AWAY IF:  Your skin problems get worse.  You feel very sad (depressed).  You have no desire to eat (loss of appetite).  You have trouble concentrating.  You have problems with your eyes, such as redness or itching. MAKE SURE YOU:  Understand these instructions.  Will watch your condition.  Will get help right away if you are not doing well or get worse. Document Released: 02/18/2012 Document Reviewed: 02/18/2012 [employer] Patient Information 2015 [place], [place]. This information is not intended to replace advice given to you by your health care provider. Make sure you discuss any questions you have with your health care provider.

## 2014-09-01 NOTE — Progress Notes (Signed)
Subjective:    Patient ID: Caroline Rasmussen, female    DOB: 11-26-1966, 48 y.o.   MRN: [id number]  Rash This is a chronic problem. The current episode started more than 1 year ago. The problem has been waxing and waning since onset. The affected locations include the face. The rash is characterized by redness (sweating). She was exposed to nothing. Pertinent negatives include no anorexia, congestion, cough, diarrhea, eye pain, facial edema, fatigue, fever, joint pain, nail changes, rhinorrhea, shortness of breath, sore throat or vomiting. [name] had a stye after an episode last year) Treatments tried: gentle face care/cleaning, cold cloth, makeup cover up. The treatment provided no relief.      Review of Systems  Constitutional: Negative for fever, chills and fatigue.  HENT: Negative for congestion, rhinorrhea and sore throat.   Eyes: Negative for pain.  Respiratory: Negative for cough and shortness of breath.   Cardiovascular: Negative for chest pain.  Gastrointestinal: Negative for nausea, vomiting, diarrhea and anorexia.  Musculoskeletal: Negative for joint pain.  Skin: Positive for rash. Negative for nail changes.  Neurological: Negative for syncope and headaches.  All other systems reviewed and are negative.   Past Medical History  Diagnosis Date  . GERD (gastroesophageal reflux disease)   . IBS (irritable bowel syndrome)     History   Social History  . Marital Status: Married    Spouse Name: N/A    Number of Children: N/A  . Years of Education: N/A   Occupational History  . Not on file.   Social History Main Topics  . Smoking status: Never Smoker   . Smokeless tobacco: Not on file  . Alcohol Use: 0.6 oz/week    1 Glasses of wine per week  . Drug Use: No  . Sexual Activity: Not on file   Other Topics Concern  . Not on file   Social History Narrative  . No narrative on file    Past Surgical History  Procedure Laterality Date  . Breast biopsy     benign  . Bunionectomy    . Dilation and curettage of uterus      x2 (miscarriages)    Family History  Problem Relation Age of Onset  . Hypertension Mother   . Hyperlipidemia Mother   . Diabetes Father   . Hyperlipidemia Father   . Hypertension Father     Allergies  Allergen Reactions  . Erythromycin Base     REACTION: vomiting    Current Outpatient Prescriptions on File Prior to Visit  Medication Sig Dispense Refill  . amphetamine-dextroamphetamine (ADDERALL XR) 20 MG 24 hr capsule Take 1 capsule (20 mg total) by mouth every morning.  30 capsule  0  . amphetamine-dextroamphetamine (ADDERALL XR) 20 MG 24 hr capsule Take 1 capsule (20 mg total) by mouth daily.  30 capsule  0  . amphetamine-dextroamphetamine (ADDERALL XR) 20 MG 24 hr capsule Take 1 capsule (20 mg total) by mouth daily.  30 capsule  0  . aspirin 81 MG tablet Take 81 mg by mouth daily.      . Calcium Carbonate-Vitamin D (CALCIUM 600+D) 600-200 MG-UNIT TABS Take 1 tablet by mouth daily.      . Cholecalciferol (VITAMIN D) 2000 UNITS tablet Take 2,000 Units by mouth daily.      . fish oil-omega-3 fatty acids 1000 MG capsule Take 2 g by mouth daily.      . magnesium 30 MG tablet Take 30 mg by mouth daily.      Marland Kitchen  Multiple Vitamin (MULTIVITAMIN) tablet Take 1 tablet by mouth daily.      . psyllium (METAMUCIL) 58.6 % packet Take 1 packet by mouth daily.       No current facility-administered medications on file prior to visit.    EXAM: BP 110/70  Pulse 72  Temp(Src) 98.1 F (36.7 C) (Oral)  Resp 18  Wt 138 lb 1.6 oz (62.642 kg)     Objective:   Physical Exam  Nursing note and vitals reviewed. Constitutional: She is oriented to person, place, and time. She appears well-developed and well-nourished. No distress.  HENT:  Head: Normocephalic and atraumatic.  Eyes: Conjunctivae and EOM are normal. Pupils are equal, round, and reactive to light.  Neck: Normal range of motion.  Cardiovascular: Normal rate, regular  rhythm and intact distal pulses.   Pulmonary/Chest: Effort normal and breath sounds normal. No respiratory distress. She exhibits no tenderness.  Neurological: She is alert and oriented to person, place, and time.  Skin: Skin is warm and dry. No rash noted. She is not diaphoretic. No erythema. No pallor.  No rash currently  Psychiatric: She has a normal mood and affect. Her behavior is normal. Judgment and thought content normal.     Lab Results  Component Value Date   WBC 5.5 04/21/2013   HGB 13.6 04/21/2013   HCT 39.5 04/21/2013   PLT 253.0 04/21/2013   GLUCOSE 90 04/21/2013   CHOL 204* 04/21/2013   TRIG 129.0 04/21/2013   HDL 50.80 04/21/2013   LDLDIRECT 134.2 04/21/2013   LDLCALC 125* 08/18/2009   ALT 12 04/21/2013   AST 15 04/21/2013   NA 137 04/21/2013   K 5.0 04/21/2013   CL 105 04/21/2013   CREATININE 0.8 04/21/2013   BUN 11 04/21/2013   CO2 26 04/21/2013   TSH 2.23 04/21/2013        Assessment & Plan:  Caroline Rasmussen was seen today for rosacea.  Diagnoses and associated orders for this visit:  Facial flushing Comments: ?Rosacea. Will try topical metronidazole cream. watchful waiting.  - metroNIDAZOLE (METROCREAM) 0.75 % cream; Apply topically 2 (two) times daily.    Patient will photograph the rash the next time it has an outbreak, and take this to her dermatology appointment as directed by her dermatologist.  Return precautions provided, and patient handout on rosacea.  Plan to follow up as needed, or for worsening or persistent symptoms despite treatment.  Patient Instructions  Trial of topical Metronidazole gel. As directed, thin film to affected areas 1 to 2 times daily after washing face.  If emergency symptoms discussed during visit developed, seek medical attention immediately.  Followup as needed, or for worsening or persistent symptoms despite treatment.

## 2014-12-27 ENCOUNTER — Other Ambulatory Visit: Payer: Self-pay | Admitting: Internal Medicine

## 2014-12-27 MED ORDER — AMPHETAMINE-DEXTROAMPHET ER 20 MG PO CP24: 20.0000 mg | ORAL_CAPSULE | ORAL | Status: DC

## 2014-12-27 NOTE — Telephone Encounter (Signed)
Spoke to pt, told her I can only give one month supply due to pt due for Physical Feb. Pt verbalized understanding. Told pt Rx ready for pickup, will be at the front desk. Rx printed and signed.

## 2015-01-05 ENCOUNTER — Ambulatory Visit (INDEPENDENT_AMBULATORY_CARE_PROVIDER_SITE_OTHER)
Admission: RE | Admit: 2015-01-05 | Discharge: 2015-01-05 | Disposition: A | Discharge status: Home / Self Care | Payer: BLUE CROSS/BLUE SHIELD | Source: Ambulatory Visit | Attending: Internal Medicine | Admitting: Internal Medicine

## 2015-01-05 ENCOUNTER — Other Ambulatory Visit: Payer: BLUE CROSS/BLUE SHIELD

## 2015-01-05 ENCOUNTER — Encounter: Payer: Self-pay | Admitting: Internal Medicine

## 2015-01-05 ENCOUNTER — Encounter: Payer: Self-pay | Admitting: *Deleted

## 2015-01-05 ENCOUNTER — Ambulatory Visit (INDEPENDENT_AMBULATORY_CARE_PROVIDER_SITE_OTHER): Payer: BLUE CROSS/BLUE SHIELD | Admitting: Internal Medicine

## 2015-01-05 VITALS — BP 120/80 | HR 91 | Temp 98.0°F | Resp 20 | Ht 65.0 in | Wt 140.0 lb

## 2015-01-05 DIAGNOSIS — M1990 Unspecified osteoarthritis, unspecified site: Secondary | ICD-10-CM

## 2015-01-05 DIAGNOSIS — K589 Irritable bowel syndrome without diarrhea: Secondary | ICD-10-CM

## 2015-01-05 DIAGNOSIS — D682 Hereditary deficiency of other clotting factors: Secondary | ICD-10-CM

## 2015-01-05 DIAGNOSIS — Z23 Encounter for immunization: Secondary | ICD-10-CM

## 2015-01-05 LAB — SEDIMENTATION RATE: SED RATE: 23 mm/h — AB (ref 0–22)

## 2015-01-05 NOTE — Progress Notes (Signed)
Pre visit review using our clinic review tool, if applicable. No additional management support is needed unless otherwise documented below in the visit note. 

## 2015-01-05 NOTE — Progress Notes (Signed)
Subjective:    Patient ID: Caroline FloorLisa A Loree, female    DOB: 10/29/1966, 49 y.o.   MRN: [id number]  HPI  49 year old patient who presents today with a chief complaint of pain and swelling involving her right third MCP joint.  This has been present for about 3 months.  No history of trauma.  She also states she has had some increasing fatigue for the past month.  She states for years she has had some generalized pain and stiffness involving her hands that has been very mild and has not intensified.  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  No other constitutional complaints.  She does have stiffness in the morning that seems fairly mild, last about 30 minutes.  She did have screening lab work recently that was unremarkable  Past Medical History  Diagnosis Date  . GERD (gastroesophageal reflux disease)   . IBS (irritable bowel syndrome)     History   Social History  . Marital Status: Married    Spouse Name: N/A    Number of Children: N/A  . Years of Education: N/A   Occupational History  . Not on file.   Social History Main Topics  . Smoking status: Never Smoker   . Smokeless tobacco: Not on file  . Alcohol Use: 0.6 oz/week    1 Glasses of wine per week  . Drug Use: No  . Sexual Activity: Not on file   Other Topics Concern  . Not on file   Social History Narrative    Past Surgical History  Procedure Laterality Date  . Breast biopsy      benign  . Bunionectomy    . Dilation and curettage of uterus      x2 (miscarriages)    Family History  Problem Relation Age of Onset  . Hypertension Mother   . Hyperlipidemia Mother   . Diabetes Father   . Hyperlipidemia Father   . Hypertension Father     Allergies  Allergen Reactions  . Erythromycin Base     REACTION: vomiting    Current Outpatient Prescriptions on File Prior to Visit  Medication Sig Dispense Refill  . amphetamine-dextroamphetamine (ADDERALL XR)  20 MG 24 hr capsule Take 1 capsule (20 mg total) by mouth daily. 30 capsule 0  . amphetamine-dextroamphetamine (ADDERALL XR) 20 MG 24 hr capsule Take 1 capsule (20 mg total) by mouth daily. 30 capsule 0  . amphetamine-dextroamphetamine (ADDERALL XR) 20 MG 24 hr capsule Take 1 capsule (20 mg total) by mouth every morning. 30 capsule 0  . aspirin 81 MG tablet Take 81 mg by mouth daily.    . Calcium Carbonate-Vitamin D (CALCIUM 600+D) 600-200 MG-UNIT TABS Take 1 tablet by mouth daily.    . Cholecalciferol (VITAMIN D) 2000 UNITS tablet Take 2,000 Units by mouth daily.    . fish oil-omega-3 fatty acids 1000 MG capsule Take 2 g by mouth daily.    . magnesium 30 MG tablet Take 30 mg by mouth daily.    . metroNIDAZOLE (METROCREAM) 0.75 % cream Apply topically 2 (two) times daily. (Patient taking differently: Apply 1 application topically as needed. ) 45 g 0  . Multiple Vitamin (MULTIVITAMIN) tablet Take 1 tablet by mouth daily.    . psyllium (METAMUCIL) 58.6 % packet Take 1 packet by mouth daily.     No current facility-administered medications on file prior to visit.    BP 120/80 mmHg  Pulse  91  Temp(Src) 98 F (36.7 C) (Oral)  Resp 20  Ht  (1.651 m)  Wt 140 lb (63.504 kg)  BMI 23.30 kg/m2  SpO2 97%     Review of Systems  Constitutional: Positive for fatigue.  HENT: Negative for congestion, dental problem, hearing loss, rhinorrhea, sinus pressure, sore throat and tinnitus.   Eyes: Negative for pain, discharge and visual disturbance.  Respiratory: Negative for cough and shortness of breath.   Cardiovascular: Negative for chest pain, palpitations and leg swelling.  Gastrointestinal: Negative for nausea, vomiting, abdominal pain, diarrhea, constipation, blood in stool and abdominal distention.  Genitourinary: Negative for dysuria, urgency, frequency, hematuria, flank pain, vaginal bleeding, vaginal discharge, difficulty urinating, vaginal pain and pelvic pain.  Musculoskeletal:  Positive for joint swelling and arthralgias. Negative for gait problem.  Skin: Negative for rash.  Neurological: Negative for dizziness, syncope, speech difficulty, weakness, numbness and headaches.  Hematological: Negative for adenopathy.  Psychiatric/Behavioral: Negative for behavioral problems, dysphoric mood and agitation. The patient is not nervous/anxious.        Objective:   Physical Exam  Constitutional: She is oriented to person, place, and time. She appears well-developed and well-nourished.  HENT:  Head: Normocephalic.  Right Ear: External ear normal.  Left Ear: External ear normal.  Mouth/Throat: Oropharynx is clear and moist.  Eyes: Conjunctivae and EOM are normal. Pupils are equal, round, and reactive to light.  Neck: Normal range of motion. Neck supple. No thyromegaly present.  Cardiovascular: Normal rate, regular rhythm, normal heart sounds and intact distal pulses.   Pulmonary/Chest: Effort normal and breath sounds normal.  Abdominal: Soft. Bowel sounds are normal. She exhibits no mass. There is no tenderness.  Musculoskeletal: Normal range of motion.  Erythema and soft tissue swelling about the right third MCP joint  Lymphadenopathy:    She has no cervical adenopathy.  Neurological: She is alert and oriented to person, place, and time.  Skin: Skin is warm and dry. No rash noted.  Psychiatric: She has a normal mood and affect. Her behavior is normal.          Assessment & Plan:   Inflammatory changes right third MCP joint.  Will check a x-ray and sedimentation rate History of fatigue and mild generalized stiffness  CPX as scheduled in one month

## 2015-01-05 NOTE — Patient Instructions (Signed)
X-ray of right hand as discussed  Take 400-600 mg of ibuprofen ( Advil, Motrin) with food every 4 to 6 hours as needed for pain relief or control of fever

## 2015-01-10 ENCOUNTER — Encounter: Payer: Self-pay | Admitting: Internal Medicine

## 2015-01-13 ENCOUNTER — Ambulatory Visit (INDEPENDENT_AMBULATORY_CARE_PROVIDER_SITE_OTHER): Payer: BLUE CROSS/BLUE SHIELD | Admitting: Internal Medicine

## 2015-01-13 ENCOUNTER — Encounter: Payer: Self-pay | Admitting: Internal Medicine

## 2015-01-13 VITALS — BP 110/86 | HR 103 | Temp 98.6°F | Resp 20 | Ht 65.0 in | Wt 138.0 lb

## 2015-01-13 DIAGNOSIS — K589 Irritable bowel syndrome without diarrhea: Secondary | ICD-10-CM

## 2015-01-13 DIAGNOSIS — D682 Hereditary deficiency of other clotting factors: Secondary | ICD-10-CM

## 2015-01-13 DIAGNOSIS — Z0000 Encounter for general adult medical examination without abnormal findings: Secondary | ICD-10-CM

## 2015-01-13 MED ORDER — AMPHETAMINE-DEXTROAMPHET ER 20 MG PO CP24: 20.0000 mg | ORAL_CAPSULE | Freq: Every day | ORAL | Status: DC

## 2015-01-13 MED ORDER — AMPHETAMINE-DEXTROAMPHET ER 20 MG PO CP24: 20.0000 mg | ORAL_CAPSULE | ORAL | Status: DC

## 2015-01-13 MED ORDER — AMPHETAMINE-DEXTROAMPHETAMINE 10 MG PO TABS: 10.0000 mg | ORAL_TABLET | Freq: Two times a day (BID) | ORAL | Status: DC

## 2015-01-13 NOTE — Progress Notes (Signed)
Pre visit review using our clinic review tool, if applicable. No additional management support is needed unless otherwise documented below in the visit note. 

## 2015-01-13 NOTE — Progress Notes (Signed)
Subjective:    Patient ID: Caroline Rasmussen, female    DOB: 07/18/1966, 49 y.o.   MRN: [id number]  HPI 49 year old patient who is seen today for a preventive health examination.  She has been seen by GYN recently. She was seen last month for an office visit, complaining of pain and swelling involving her right third MCP joint.  This has improved somewhat.  Radiographs were negative.  Sedimentation rate was normal.  She also give a history of some mild stiffness and generalized fatigue. She does have a history and a strong family history of factor V Leiden abnormality.  No history of DVT, but has had multiple miscarriages. She has ADHD which has been well-controlled on Adderall. No new concerns or complaints  Past Medical History  Diagnosis Date  . GERD (gastroesophageal reflux disease)   . IBS (irritable bowel syndrome)     History   Social History  . Marital Status: Married    Spouse Name: N/A    Number of Children: N/A  . Years of Education: N/A   Occupational History  . Not on file.   Social History Main Topics  . Smoking status: Never Smoker   . Smokeless tobacco: Not on file  . Alcohol Use: 0.6 oz/week    1 Glasses of wine per week  . Drug Use: No  . Sexual Activity: Not on file   Other Topics Concern  . Not on file   Social History Narrative    Past Surgical History  Procedure Laterality Date  . Breast biopsy      benign  . Bunionectomy    . Dilation and curettage of uterus      x2 (miscarriages)    Family History  Problem Relation Age of Onset  . Hypertension Mother   . Hyperlipidemia Mother   . Diabetes Father   . Hyperlipidemia Father   . Hypertension Father     Allergies  Allergen Reactions  . Erythromycin Base     REACTION: vomiting    Current Outpatient Prescriptions on File Prior to Visit  Medication Sig Dispense Refill  . aspirin 81 MG tablet Take 81 mg by mouth daily.    . Calcium Carbonate-Vitamin D (CALCIUM 600+D) 600-200  MG-UNIT TABS Take 1 tablet by mouth daily.    . Cholecalciferol (VITAMIN D) 2000 UNITS tablet Take 2,000 Units by mouth daily.    . fish oil-omega-3 fatty acids 1000 MG capsule Take 2 g by mouth daily.    . magnesium 30 MG tablet Take 30 mg by mouth daily.    . metroNIDAZOLE (METROCREAM) 0.75 % cream Apply topically 2 (two) times daily. (Patient taking differently: Apply 1 application topically as needed. ) 45 g 0  . Multiple Vitamin (MULTIVITAMIN) tablet Take 1 tablet by mouth daily.    . psyllium (METAMUCIL) 58.6 % packet Take 1 packet by mouth daily.     No current facility-administered medications on file prior to visit.    BP 110/86 mmHg  Pulse 103  Temp(Src) 98.6 F (37 C) (Oral)  Resp 20  Ht 5\' 5"  (1.651 m)  Wt 138 lb (62.596 kg)  BMI 22.96 kg/m2  SpO2 97%  LMP 11/16/2014      Review of Systems  Constitutional: Positive for fatigue.  HENT: Negative for congestion, dental problem, hearing loss, rhinorrhea, sinus pressure, sore throat and tinnitus.   Eyes: Negative for pain, discharge and visual disturbance.       Congenital nystagmus  Respiratory: Negative for cough  and shortness of breath.   Cardiovascular: Negative for chest pain, palpitations and leg swelling.  Gastrointestinal: Negative for nausea, vomiting, abdominal pain, diarrhea, constipation, blood in stool and abdominal distention.  Genitourinary: Negative for dysuria, urgency, frequency, hematuria, flank pain, vaginal bleeding, vaginal discharge, difficulty urinating, vaginal pain and pelvic pain.  Musculoskeletal: Negative for joint swelling, arthralgias and gait problem.  Skin: Negative for rash.  Neurological: Negative for dizziness, syncope, speech difficulty, weakness, numbness and headaches.  Hematological: Negative for adenopathy.  Psychiatric/Behavioral: Negative for behavioral problems, dysphoric mood and agitation. The patient is not nervous/anxious.        Objective:   Physical Exam    Constitutional: She is oriented to person, place, and time. She appears well-developed and well-nourished.  HENT:  Head: Normocephalic and atraumatic.  Right Ear: External ear normal.  Left Ear: External ear normal.  Mouth/Throat: Oropharynx is clear and moist.  Eyes: Conjunctivae and EOM are normal.  Nystagmus  Neck: Normal range of motion. Neck supple. No JVD present. No thyromegaly present.  Cardiovascular: Normal rate, regular rhythm, normal heart sounds and intact distal pulses.   No murmur heard. Pulmonary/Chest: Effort normal and breath sounds normal. She has no wheezes. She has no rales.  Abdominal: Soft. Bowel sounds are normal. She exhibits no distension and no mass. There is no tenderness. There is no rebound and no guarding.  Musculoskeletal: Normal range of motion. She exhibits no edema or tenderness.  Prominence of the right third MCP joint  Neurological: She is alert and oriented to person, place, and time. She has normal reflexes. No cranial nerve deficit. She exhibits normal muscle tone. Coordination normal.  Skin: Skin is warm and dry. No rash noted.  Psychiatric: She has a normal mood and affect. Her behavior is normal.          Assessment & Plan:   Preventive health examination ADHD Factor V Leiden deficiency  Medications updated We'll continue heart healthy diet, exercise regimen No change in therapy Recheck one year or as needed

## 2015-01-13 NOTE — Patient Instructions (Signed)

## 2015-07-25 ENCOUNTER — Encounter: Payer: Self-pay | Admitting: Internal Medicine

## 2015-07-26 MED ORDER — AMPHETAMINE-DEXTROAMPHET ER 20 MG PO CP24: 20.0000 mg | ORAL_CAPSULE | Freq: Every day | ORAL | Status: DC

## 2015-07-26 MED ORDER — AMPHETAMINE-DEXTROAMPHET ER 20 MG PO CP24: 20.0000 mg | ORAL_CAPSULE | ORAL | Status: DC

## 2015-09-14 ENCOUNTER — Encounter: Payer: Self-pay | Admitting: Internal Medicine

## 2015-11-09 ENCOUNTER — Other Ambulatory Visit: Payer: Self-pay | Admitting: Internal Medicine

## 2015-11-09 NOTE — Telephone Encounter (Signed)
Please see message from pt and advise if you want to change dosage?

## 2015-11-09 NOTE — Telephone Encounter (Signed)
ok 

## 2015-11-11 MED ORDER — AMPHETAMINE-DEXTROAMPHETAMINE 10 MG PO TABS: 10.0000 mg | ORAL_TABLET | Freq: Two times a day (BID) | ORAL | Status: DC

## 2015-11-11 MED ORDER — AMPHETAMINE-DEXTROAMPHET ER 20 MG PO CP24: 20.0000 mg | ORAL_CAPSULE | Freq: Every day | ORAL | Status: DC

## 2015-11-11 MED ORDER — AMPHETAMINE-DEXTROAMPHET ER 30 MG PO CP24: 30.0000 mg | ORAL_CAPSULE | Freq: Every day | ORAL | Status: DC

## 2015-11-11 MED ORDER — AMPHETAMINE-DEXTROAMPHET ER 20 MG PO CP24: 20.0000 mg | ORAL_CAPSULE | ORAL | Status: DC

## 2015-11-11 NOTE — Addendum Note (Signed)
Addended by: Jimmye NormanPHANOS, Ski Polich J on: 11/11/2015 08:35 AM   Modules accepted: Orders

## 2015-11-11 NOTE — Telephone Encounter (Signed)
Discussed Adderall dosage with Dr. Kirtland BouchardK and he said to increase XR to 30 mg daily.

## 2015-11-11 NOTE — Telephone Encounter (Signed)
Spoke to pt, told her XR was increased to 30 mg, other Rx remained the same. Pt verbalized understanding. Pt notified Rx's ready for pickup will be at the front desk. Pt verbalized understanding.  Rx'sprinted and signed.

## 2015-11-11 NOTE — Addendum Note (Signed)
Addended by: Jimmye NormanPHANOS, Chidiebere Wynn J on: 11/11/2015 09:14 AM   Modules accepted: Orders

## 2015-12-21 ENCOUNTER — Ambulatory Visit (INDEPENDENT_AMBULATORY_CARE_PROVIDER_SITE_OTHER): Payer: BLUE CROSS/BLUE SHIELD | Admitting: Internal Medicine

## 2015-12-21 ENCOUNTER — Encounter: Payer: Self-pay | Admitting: Internal Medicine

## 2015-12-21 VITALS — BP 120/76 | HR 97 | Temp 98.1°F | Resp 20 | Ht 65.0 in | Wt 143.0 lb

## 2015-12-21 DIAGNOSIS — Z23 Encounter for immunization: Secondary | ICD-10-CM | POA: Diagnosis not present

## 2015-12-21 DIAGNOSIS — Z0000 Encounter for general adult medical examination without abnormal findings: Secondary | ICD-10-CM | POA: Diagnosis not present

## 2015-12-21 DIAGNOSIS — K588 Other irritable bowel syndrome: Secondary | ICD-10-CM

## 2015-12-21 DIAGNOSIS — F988 Other specified behavioral and emotional disorders with onset usually occurring in childhood and adolescence: Secondary | ICD-10-CM

## 2015-12-21 DIAGNOSIS — F909 Attention-deficit hyperactivity disorder, unspecified type: Secondary | ICD-10-CM | POA: Diagnosis not present

## 2015-12-21 LAB — LIPID PANEL
CHOLESTEROL: 242 mg/dL — AB (ref 0–200)
HDL: 66.2 mg/dL (ref >39.00)
LDL Cholesterol: 145 mg/dL — ABNORMAL HIGH (ref 0–99)
NONHDL: 175.95
TRIGLYCERIDES: 155 mg/dL — AB (ref 0.0–149.0)
Total CHOL/HDL Ratio: 4
VLDL: 31 mg/dL (ref 0.0–40.0)

## 2015-12-21 LAB — CBC WITH DIFFERENTIAL/PLATELET
BASOS ABS: 0 10*3/uL (ref 0.0–0.1)
Basophils Relative: 0.6 % (ref 0.0–3.0)
EOS ABS: 0.1 10*3/uL (ref 0.0–0.7)
Eosinophils Relative: 2.4 % (ref 0.0–5.0)
HEMATOCRIT: 44.1 % (ref 36.0–46.0)
Hemoglobin: 14.5 g/dL (ref 12.0–15.0)
LYMPHS ABS: 1.9 10*3/uL (ref 0.7–4.0)
LYMPHS PCT: 33.2 % (ref 12.0–46.0)
MCHC: 33 g/dL (ref 30.0–36.0)
MCV: 89.8 fl (ref 78.0–100.0)
Monocytes Absolute: 0.6 10*3/uL (ref 0.1–1.0)
Monocytes Relative: 10.8 % (ref 3.0–12.0)
NEUTROS ABS: 3 10*3/uL (ref 1.4–7.7)
Neutrophils Relative %: 53 % (ref 43.0–77.0)
PLATELETS: 305 10*3/uL (ref 150.0–400.0)
RBC: 4.91 Mil/uL (ref 3.87–5.11)
RDW: 12.9 % (ref 11.5–15.5)
WBC: 5.8 10*3/uL (ref 4.0–10.5)

## 2015-12-21 LAB — COMPREHENSIVE METABOLIC PANEL
ALK PHOS: 64 U/L (ref 39–117)
ALT: 13 U/L (ref 0–35)
AST: 20 U/L (ref 0–37)
Albumin: 4.9 g/dL (ref 3.5–5.2)
BILIRUBIN TOTAL: 0.4 mg/dL (ref 0.2–1.2)
BUN: 13 mg/dL (ref 6–23)
CO2: 30 meq/L (ref 19–32)
CREATININE: 0.79 mg/dL (ref 0.40–1.20)
Calcium: 9.9 mg/dL (ref 8.4–10.5)
Chloride: 101 mEq/L (ref 96–112)
GFR: 81.9 mL/min (ref >60.00)
Glucose, Bld: 88 mg/dL (ref 70–99)
Potassium: 4.5 mEq/L (ref 3.5–5.1)
Sodium: 139 mEq/L (ref 135–145)
TOTAL PROTEIN: 7.7 g/dL (ref 6.0–8.3)

## 2015-12-21 LAB — TSH: TSH: 3.06 u[IU]/mL (ref 0.35–4.50)

## 2015-12-21 NOTE — Patient Instructions (Signed)
Follow up with behavioral health °

## 2015-12-21 NOTE — Progress Notes (Signed)
Subjective:    Patient ID: Caroline Rasmussen, female    DOB: [birth year], 50 y.o.   MRN: [id number]  HPI   01/13/14. HPI   50 year old patient who is seen today concerns about ADD.  She has a daughter with ADD who has had a very nice clinical response to Concerta. The patient has had difficulty with the focusing and completing tasks her entire life. She had difficulties in middle school and took a number of years to complete college. At the present time she is back in school trying to get a masters in accounting and is having a very difficult time completing homework and performing well academically. 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  Patient presents today complaining of memory issues.  She does complete her fall semester ended very poorly academically.  She normally gets excellent grades even prior to treatment for ADD, even though she required much more time completing homework and other tasks.  She generally feels unwell with poor energy level.  She is concerned about possible stress or menopausal syndrome.  She has been menopausal since December 2015 and is seen by GYN.  At least annually. She complains of feeling"out of sorts".  She states she missed several assignments during her fall semester.  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  Denies any significant stressors or depression and no relationship issues with family. Her Adderall dose was mildly increased without benefit  Past Medical History  Diagnosis Date  . GERD (gastroesophageal reflux disease)   . IBS (irritable bowel syndrome)     Social History   Social History  . Marital Status: Married    Spouse Name: N/A  . Number of Children: N/A  . Years of Education: N/A   Occupational History  . Not on file.   Social History Main Topics  . Smoking status: Never Smoker   . Smokeless tobacco: Not on file  . Alcohol Use: 0.6  oz/week    1 Glasses of wine per week  . Drug Use: No  . Sexual Activity: Not on file   Other Topics Concern  . Not on file   Social History Narrative    Past Surgical History  Procedure Laterality Date  . Breast biopsy      benign  . Bunionectomy    . Dilation and curettage of uterus      x2 (miscarriages)    Family History  Problem Relation Age of Onset  . Hypertension Mother   . Hyperlipidemia Mother   . Diabetes Father   . Hyperlipidemia Father   . Hypertension Father     Allergies  Allergen Reactions  . Erythromycin Base     REACTION: vomiting    Current Outpatient Prescriptions on File Prior to Visit  Medication Sig Dispense Refill  . amphetamine-dextroamphetamine (ADDERALL XR) 30 MG 24 hr capsule Take 1 capsule (30 mg total) by mouth daily. 30 capsule 0  . amphetamine-dextroamphetamine (ADDERALL XR) 30 MG 24 hr capsule Take 1 capsule (30 mg total) by mouth daily. 30 capsule 0  . amphetamine-dextroamphetamine (ADDERALL XR) 30 MG 24 hr capsule Take 1 capsule (30 mg total) by mouth daily. 30 capsule 0  . amphetamine-dextroamphetamine (ADDERALL) 10 MG tablet Take 1 tablet (10 mg total) by mouth 2 (two) times daily. 60 tablet 0  . amphetamine-dextroamphetamine (ADDERALL) 10 MG tablet Take 1 tablet (10 mg total) by mouth 2 (two) times daily. 60  tablet 0  . amphetamine-dextroamphetamine (ADDERALL) 10 MG tablet Take 1 tablet (10 mg total) by mouth 2 (two) times daily with a meal. 60 tablet 0  . aspirin 81 MG tablet Take 81 mg by mouth daily.    . Calcium Carbonate-Vitamin D (CALCIUM 600+D) 600-200 MG-UNIT TABS Take 1 tablet by mouth daily.    . Cholecalciferol (VITAMIN D) 2000 UNITS tablet Take 2,000 Units by mouth daily.    . fish oil-omega-3 fatty acids 1000 MG capsule Take 2 g by mouth daily.    . magnesium 30 MG tablet Take 30 mg by mouth daily.    . Multiple Vitamin (MULTIVITAMIN) tablet Take 1 tablet by mouth daily.    . psyllium (METAMUCIL) 58.6 % packet Take 1  packet by mouth daily as needed.      No current facility-administered medications on file prior to visit.    BP 120/76 mmHg  Pulse 97  Temp(Src) 98.1 F (36.7 C) (Oral)  Resp 20  Ht 5\' 5"  (1.651 m)  Wt 143 lb (64.864 kg)  BMI 23.80 kg/m2  SpO2 98%     Review of Systems  Constitutional: Positive for activity change and fatigue.  HENT: Negative for congestion, dental problem, hearing loss, rhinorrhea, sinus pressure, sore throat and tinnitus.   Eyes: Negative for pain, discharge and visual disturbance.  Respiratory: Negative for cough and shortness of breath.   Cardiovascular: Negative for chest pain, palpitations and leg swelling.  Gastrointestinal: Negative for nausea, vomiting, abdominal pain, diarrhea, constipation, blood in stool and abdominal distention.  Genitourinary: Negative for dysuria, urgency, frequency, hematuria, flank pain, vaginal bleeding, vaginal discharge, difficulty urinating, vaginal pain and pelvic pain.  Musculoskeletal: Negative for joint swelling, arthralgias and gait problem.  Skin: Negative for rash.  Neurological: Negative for dizziness, syncope, speech difficulty, weakness, numbness and headaches.  Hematological: Negative for adenopathy.  Psychiatric/Behavioral: Positive for behavioral problems and decreased concentration. Negative for dysphoric mood and agitation. The patient is not nervous/anxious.        Objective:   Physical Exam  Constitutional: She is oriented to person, place, and time. She appears well-developed and well-nourished.  HENT:  Head: Normocephalic.  Right Ear: External ear normal.  Left Ear: External ear normal.  Mouth/Throat: Oropharynx is clear and moist.  Eyes: Conjunctivae and EOM are normal. Pupils are equal, round, and reactive to light.  Neck: Normal range of motion. Neck supple. No thyromegaly present.  Cardiovascular: Normal rate, regular rhythm, normal heart sounds and intact distal pulses.   Pulmonary/Chest:  Effort normal and breath sounds normal.  Abdominal: Soft. Bowel sounds are normal. She exhibits no mass. There is no tenderness.  Musculoskeletal: Normal range of motion.  Lymphadenopathy:    She has no cervical adenopathy.  Neurological: She is alert and oriented to person, place, and time.  Reflexes brisk  Skin: Skin is warm and dry. No rash noted.  Psychiatric: She has a normal mood and affect. Her behavior is normal.          Assessment & Plan:   ADD Patient has had recent poor academic performance and generally feels unwell.  Clinical exam is noncontributory.  We'll check updated lab.  Will refer for behavioral health evaluation and management

## 2016-03-28 DIAGNOSIS — Z1151 Encounter for screening for human papillomavirus (HPV): Secondary | ICD-10-CM | POA: Diagnosis not present

## 2016-03-28 DIAGNOSIS — Z6824 Body mass index (BMI) 24.0-24.9, adult: Secondary | ICD-10-CM | POA: Diagnosis not present

## 2016-03-28 DIAGNOSIS — Z01419 Encounter for gynecological examination (general) (routine) without abnormal findings: Secondary | ICD-10-CM | POA: Diagnosis not present

## 2016-10-01 DIAGNOSIS — Z803 Family history of malignant neoplasm of breast: Secondary | ICD-10-CM | POA: Diagnosis not present

## 2016-10-01 DIAGNOSIS — Z1231 Encounter for screening mammogram for malignant neoplasm of breast: Secondary | ICD-10-CM | POA: Diagnosis not present

## 2016-10-01 LAB — HM MAMMOGRAPHY

## 2016-10-04 ENCOUNTER — Encounter: Payer: Self-pay | Admitting: Internal Medicine

## 2017-01-08 ENCOUNTER — Encounter: Payer: Self-pay | Admitting: Internal Medicine

## 2017-01-09 ENCOUNTER — Other Ambulatory Visit (INDEPENDENT_AMBULATORY_CARE_PROVIDER_SITE_OTHER): Payer: BLUE CROSS/BLUE SHIELD

## 2017-01-09 DIAGNOSIS — Z0000 Encounter for general adult medical examination without abnormal findings: Secondary | ICD-10-CM | POA: Diagnosis not present

## 2017-01-09 DIAGNOSIS — E559 Vitamin D deficiency, unspecified: Secondary | ICD-10-CM

## 2017-01-09 LAB — POC URINALSYSI DIPSTICK (AUTOMATED)
Bilirubin, UA: NEGATIVE
Glucose, UA: NEGATIVE
Ketones, UA: NEGATIVE
LEUKOCYTES UA: NEGATIVE
Nitrite, UA: NEGATIVE
PROTEIN UA: NEGATIVE
Spec Grav, UA: 1.01
UROBILINOGEN UA: 0.2
pH, UA: 6.5

## 2017-01-09 LAB — BASIC METABOLIC PANEL
BUN: 23 mg/dL (ref 6–23)
CALCIUM: 9.3 mg/dL (ref 8.4–10.5)
CHLORIDE: 100 meq/L (ref 96–112)
CO2: 30 meq/L (ref 19–32)
Creatinine, Ser: 0.78 mg/dL (ref 0.40–1.20)
GFR: 82.76 mL/min (ref >60.00)
Glucose, Bld: 83 mg/dL (ref 70–99)
Potassium: 4.7 mEq/L (ref 3.5–5.1)
SODIUM: 137 meq/L (ref 135–145)

## 2017-01-09 LAB — LIPID PANEL
CHOLESTEROL: 210 mg/dL — AB (ref 0–200)
HDL: 63.6 mg/dL (ref >39.00)
LDL Cholesterol: 129 mg/dL — ABNORMAL HIGH (ref 0–99)
NonHDL: 146.1
Total CHOL/HDL Ratio: 3
Triglycerides: 87 mg/dL (ref 0.0–149.0)
VLDL: 17.4 mg/dL (ref 0.0–40.0)

## 2017-01-09 LAB — CBC WITH DIFFERENTIAL/PLATELET
BASOS ABS: 0 10*3/uL (ref 0.0–0.1)
BASOS PCT: 0.7 % (ref 0.0–3.0)
Eosinophils Absolute: 0.2 10*3/uL (ref 0.0–0.7)
Eosinophils Relative: 3.9 % (ref 0.0–5.0)
HEMATOCRIT: 41.3 % (ref 36.0–46.0)
HEMOGLOBIN: 13.9 g/dL (ref 12.0–15.0)
LYMPHS PCT: 42.1 % (ref 12.0–46.0)
Lymphs Abs: 2 10*3/uL (ref 0.7–4.0)
MCHC: 33.8 g/dL (ref 30.0–36.0)
MCV: 90.3 fl (ref 78.0–100.0)
MONOS PCT: 10.4 % (ref 3.0–12.0)
Monocytes Absolute: 0.5 10*3/uL (ref 0.1–1.0)
NEUTROS ABS: 2 10*3/uL (ref 1.4–7.7)
Neutrophils Relative %: 42.9 % — ABNORMAL LOW (ref 43.0–77.0)
PLATELETS: 288 10*3/uL (ref 150.0–400.0)
RBC: 4.57 Mil/uL (ref 3.87–5.11)
RDW: 13.3 % (ref 11.5–15.5)
WBC: 4.7 10*3/uL (ref 4.0–10.5)

## 2017-01-09 LAB — HEPATIC FUNCTION PANEL
ALBUMIN: 4.7 g/dL (ref 3.5–5.2)
ALK PHOS: 74 U/L (ref 39–117)
ALT: 19 U/L (ref 0–35)
AST: 18 U/L (ref 0–37)
BILIRUBIN DIRECT: 0.1 mg/dL (ref 0.0–0.3)
TOTAL PROTEIN: 7.3 g/dL (ref 6.0–8.3)
Total Bilirubin: 0.5 mg/dL (ref 0.2–1.2)

## 2017-01-09 LAB — VITAMIN D 25 HYDROXY (VIT D DEFICIENCY, FRACTURES): VITD: 57.46 ng/mL (ref 30.00–100.00)

## 2017-01-09 LAB — TSH: TSH: 2.69 u[IU]/mL (ref 0.35–4.50)

## 2017-01-09 LAB — HEMOGLOBIN A1C: Hgb A1c MFr Bld: 5.5 % (ref 4.6–6.5)

## 2017-01-15 ENCOUNTER — Ambulatory Visit (INDEPENDENT_AMBULATORY_CARE_PROVIDER_SITE_OTHER): Payer: BLUE CROSS/BLUE SHIELD | Admitting: Internal Medicine

## 2017-01-15 ENCOUNTER — Encounter: Payer: Self-pay | Admitting: Internal Medicine

## 2017-01-15 VITALS — BP 118/64 | HR 87 | Temp 98.2°F | Ht 64.0 in | Wt 134.6 lb

## 2017-01-15 DIAGNOSIS — Z0000 Encounter for general adult medical examination without abnormal findings: Secondary | ICD-10-CM

## 2017-01-15 DIAGNOSIS — Z23 Encounter for immunization: Secondary | ICD-10-CM | POA: Diagnosis not present

## 2017-01-15 NOTE — Patient Instructions (Addendum)
Health Maintenance for Postmenopausal Women Introduction Menopause is a normal process in which your reproductive ability comes to an end. This process happens gradually over a span of months to years, usually between the ages of 20 and 50. Menopause is complete when you have missed 12 consecutive menstrual periods. It is important to talk with your health care provider about some of the most common conditions that affect postmenopausal women, such as heart disease, cancer, and bone loss (osteoporosis). Adopting a healthy lifestyle and getting preventive care can help to promote your health and wellness. Those actions can also lower your chances of developing some of these common conditions. What should I know about menopause? During menopause, you may experience a number of symptoms, such as:  Moderate-to-severe hot flashes.  Night sweats.  Decrease in sex drive.  Mood swings.  Headaches.  Tiredness.  Irritability.  Memory problems.  Insomnia. Choosing to treat or not to treat menopausal changes is an individual decision that you make with your health care provider. What should I know about hormone replacement therapy and supplements? Hormone therapy products are effective for treating symptoms that are associated with menopause, such as hot flashes and night sweats. Hormone replacement carries certain risks, especially as you become older. If you are thinking about using estrogen or estrogen with progestin treatments, discuss the benefits and risks with your health care provider. What should I know about heart disease and stroke? Heart disease, heart attack, and stroke become more likely as you age. This may be due, in part, to the hormonal changes that your body experiences during menopause. These can affect how your body processes dietary fats, triglycerides, and cholesterol. Heart attack and stroke are both medical emergencies. There are many things that you can do to help prevent  heart disease and stroke:  Have your blood pressure checked at least every 1-2 years. High blood pressure causes heart disease and increases the risk of stroke.  If you are 49-18 years old, ask your health care provider if you should take aspirin to prevent a heart attack or a stroke.  Do not use any tobacco products, including cigarettes, chewing tobacco, or electronic cigarettes. If you need help quitting, ask your health care provider.  It is important to eat a healthy diet and maintain a healthy weight.  Be sure to include plenty of vegetables, fruits, low-fat dairy products, and lean protein.  Avoid eating foods that are high in solid fats, added sugars, or salt (sodium).  Get regular exercise. This is one of the most important things that you can do for your health.  Try to exercise for at least 150 minutes each week. The type of exercise that you do should increase your heart rate and make you sweat. This is known as moderate-intensity exercise.  Try to do strengthening exercises at least twice each week. Do these in addition to the moderate-intensity exercise.  Know your numbers.Ask your health care provider to check your cholesterol and your blood glucose. Continue to have your blood tested as directed by your health care provider. What should I know about cancer screening? There are several types of cancer. Take the following steps to reduce your risk and to catch any cancer development as early as possible. Breast Cancer  Practice breast self-awareness.  This means understanding how your breasts normally appear and feel.  It also means doing regular breast self-exams. Let your health care provider know about any changes, no matter how small.  If you are 40 or  older, have a clinician do a breast exam (clinical breast exam or CBE) every year. Depending on your age, family history, and medical history, it may be recommended that you also have a yearly breast X-ray  (mammogram).  If you have a family history of breast cancer, talk with your health care provider about genetic screening.  If you are at high risk for breast cancer, talk with your health care provider about having an MRI and a mammogram every year.  Breast cancer (BRCA) gene test is recommended for women who have family members with BRCA-related cancers. Results of the assessment will determine the need for genetic counseling and BRCA1 and for BRCA2 testing. BRCA-related cancers include these types:  Breast. This occurs in males or females.  Ovarian.  Tubal. This may also be called fallopian tube cancer.  Cancer of the abdominal or pelvic lining (peritoneal cancer).  Prostate.  Pancreatic. Cervical, Uterine, and Ovarian Cancer  Your health care provider may recommend that you be screened regularly for cancer of the pelvic organs. These include your ovaries, uterus, and vagina. This screening involves a pelvic exam, which includes checking for microscopic changes to the surface of your cervix (Pap test).  For women ages 21-65, health care providers may recommend a pelvic exam and a Pap test every three years. For women ages 50-65, they may recommend the Pap test and pelvic exam, combined with testing for human papilloma virus (HPV), every five years. Some types of HPV increase your risk of cervical cancer. Testing for HPV may also be done on women of any age who have unclear Pap test results.  Other health care providers may not recommend any screening for nonpregnant women who are considered low risk for pelvic cancer and have no symptoms. Ask your health care provider if a screening pelvic exam is right for you.  If you have had past treatment for cervical cancer or a condition that could lead to cancer, you need Pap tests and screening for cancer for at least 20 years after your treatment. If Pap tests have been discontinued for you, your risk factors (such as having a new sexual  partner) need to be reassessed to determine if you should start having screenings again. Some women have medical problems that increase the chance of getting cervical cancer. In these cases, your health care provider may recommend that you have screening and Pap tests more often.  If you have a family history of uterine cancer or ovarian cancer, talk with your health care provider about genetic screening.  If you have vaginal bleeding after reaching menopause, tell your health care provider.  There are currently no reliable tests available to screen for ovarian cancer. Lung Cancer  Lung cancer screening is recommended for adults 59-37 years old who are at high risk for lung cancer because of a history of smoking. A yearly low-dose CT scan of the lungs is recommended if you:  Currently smoke.  Have a history of at least 30 pack-years of smoking and you currently smoke or have quit within the past 15 years. A pack-year is smoking an average of one pack of cigarettes per day for one year. Yearly screening should:  Continue until it has been 15 years since you quit.  Stop if you develop a health problem that would prevent you from having lung cancer treatment. Colorectal Cancer  This type of cancer can be detected and can often be prevented.  Routine colorectal cancer screening usually begins at age 9 and  continues through age 73.  If you have risk factors for colon cancer, your health care provider may recommend that you be screened at an earlier age.  If you have a family history of colorectal cancer, talk with your health care provider about genetic screening.  Your health care provider may also recommend using home test kits to check for hidden blood in your stool.  A small camera at the end of a tube can be used to examine your colon directly (sigmoidoscopy or colonoscopy). This is done to check for the earliest forms of colorectal cancer.  Direct examination of the colon should be  repeated every 5-10 years until age 39. However, if early forms of precancerous polyps or small growths are found or if you have a family history or genetic risk for colorectal cancer, you may need to be screened more often. Skin Cancer  Check your skin from head to toe regularly.  Monitor any moles. Be sure to tell your health care provider:  About any new moles or changes in moles, especially if there is a change in a mole's shape or color.  If you have a mole that is larger than the size of a pencil eraser.  If any of your family members has a history of skin cancer, especially at a young age, talk with your health care provider about genetic screening.  Always use sunscreen. Apply sunscreen liberally and repeatedly throughout the day.  Whenever you are outside, protect yourself by wearing long sleeves, pants, a wide-brimmed hat, and sunglasses. What should I know about osteoporosis? Osteoporosis is a condition in which bone destruction happens more quickly than new bone creation. After menopause, you may be at an increased risk for osteoporosis. To help prevent osteoporosis or the bone fractures that can happen because of osteoporosis, the following is recommended:  If you are 59-26 years old, get at least 1,000 mg of calcium and at least 600 mg of vitamin D per day.  If you are older than age 1 but younger than age 67, get at least 1,200 mg of calcium and at least 600 mg of vitamin D per day.  If you are older than age 38, get at least 1,200 mg of calcium and at least 800 mg of vitamin D per day. Smoking and excessive alcohol intake increase the risk of osteoporosis. Eat foods that are rich in calcium and vitamin D, and do weight-bearing exercises several times each week as directed by your health care provider. What should I know about how menopause affects my mental health? Depression may occur at any age, but it is more common as you become older. Common symptoms of depression  include:  Low or sad mood.  Changes in sleep patterns.  Changes in appetite or eating patterns.  Feeling an overall lack of motivation or enjoyment of activities that you previously enjoyed.  Frequent crying spells. Talk with your health care provider if you think that you are experiencing depression. What should I know about immunizations? It is important that you get and maintain your immunizations. These include:  Tetanus, diphtheria, and pertussis (Tdap) booster vaccine.  Influenza every year before the flu season begins.  Pneumonia vaccine.  Shingles vaccine. Your health care provider may also recommend other immunizations. This information is not intended to replace advice given to you by your health care provider. Make sure you discuss any questions you have with your health care provider. Document Released: 01/18/2006 Document Revised: 06/15/2016 Document Reviewed: 08/30/2015  2017  Elsevier Trigger Finger Trigger finger (digital tendinitis and stenosing tenosynovitis) is a common disorder that causes an often painful catching of the fingers or thumb. It occurs as a clicking, snapping, or locking of a finger in the palm of the hand. This is caused by a problem with the tendons that flex or bend the fingers sliding smoothly through their sheaths. The condition may occur in any finger or a couple fingers at the same time.  The finger may lock with the finger curled or suddenly straighten out with a snap. This is more common in patients with rheumatoid arthritis and diabetes. Left untreated, the condition may get worse to the point where the finger becomes locked in flexion, like making a fist, or less commonly locked with the finger straightened out. CAUSES   Inflammation and scarring that lead to swelling around the tendon sheath.  Repeated or forceful movements.  Rheumatoid arthritis, an autoimmune disease that affects joints.  Gout.  Diabetes mellitus. SIGNS AND  SYMPTOMS  Soreness and swelling of your finger.  A painful clicking or snapping as you bend and straighten your finger. DIAGNOSIS  Your health care provider will do a physical exam of your finger to diagnose trigger finger. TREATMENT   Splinting for 6-8 weeks may be helpful.  Nonsteroidal anti-inflammatory medicines (NSAIDs) can help to relieve the pain and inflammation.  Cortisone injections, along with splinting, may speed up recovery. Several injections may be required. Cortisone may give relief after one injection.  Surgery is another treatment that may be used if conservative treatments do not work. Surgery can be minor, without incisions (a cut does not have to be made), and can be done with a needle through the skin.  Other surgical choices involve an open procedure in which the surgeon opens the hand through a small incision and cuts the pulley so the tendon can again slide smoothly. Your hand will still work fine. HOME CARE INSTRUCTIONS  Apply ice to the injured area, twice per day:  Put ice in a plastic bag.  Place a towel between your skin and the bag.  Leave the ice on for 20 minutes, 3-4 times a day.  Rest your hand often. MAKE SURE YOU:   Understand these instructions.  Will watch your condition.  Will get help right away if you are not doing well or get worse. This information is not intended to replace advice given to you by your health care provider. Make sure you discuss any questions you have with your health care provider. Document Released: 09/15/2004 Document Revised: 07/29/2013 Document Reviewed: 04/28/2013 Elsevier Interactive Patient Education  2017 [employer].

## 2017-01-15 NOTE — Progress Notes (Signed)
Pre visit review using our clinic review tool, if applicable. No additional management support is needed unless otherwise documented below in the visit note. 

## 2017-01-15 NOTE — Progress Notes (Addendum)
Subjective:    Patient ID: Caroline Rasmussen, female    DOB: 04/04/1966, 51 y.o.   MRN: [id number]  HPI  51 year old patient who enjoys excellent health who is seen today for a preventive health examination She has a history of IBS and did have a colonoscopy in 2009. She has a history of ADHD, but no longer takes stimulants since completing school for [employer] certification Doing quite well. Her son got her started on a regular exercise regimen that she continues to enjoy. She takes calcium and vitamin D supplements and otherwise no chronic medications  Both parents age 275.  Father with hypercholesterolemia, hypertension and diabetes Mother with peripheral vascular disease, hypertension and dyslipidemia and also a history of glaucoma   Past Medical History:  Diagnosis Date  . GERD (gastroesophageal reflux disease)   . IBS (irritable bowel syndrome)      Social History   Social History  . Marital status: Married    Spouse name: N/A  . Number of children: N/A  . Years of education: N/A   Occupational History  . Not on file.   Social History Main Topics  . Smoking status: Never Smoker  . Smokeless tobacco: Never Used  . Alcohol use 0.6 oz/week    1 Glasses of wine per week  . Drug use: No  . Sexual activity: Not on file   Other Topics Concern  . Not on file   Social History Narrative  . No narrative on file    Past Surgical History:  Procedure Laterality Date  . BREAST BIOPSY     benign  . BUNIONECTOMY    . DILATION AND CURETTAGE OF UTERUS     x2 (miscarriages)    Family History  Problem Relation Age of Onset  . Hypertension Mother   . Hyperlipidemia Mother   . Diabetes Father   . Hyperlipidemia Father   . Hypertension Father     Allergies  Allergen Reactions  . Erythromycin Base     REACTION: vomiting    Current Outpatient Prescriptions on File Prior to Visit  Medication Sig Dispense Refill  . amphetamine-dextroamphetamine (ADDERALL XR) 30 MG 24  hr capsule Take 1 capsule (30 mg total) by mouth daily. 30 capsule 0  . amphetamine-dextroamphetamine (ADDERALL XR) 30 MG 24 hr capsule Take 1 capsule (30 mg total) by mouth daily. 30 capsule 0  . amphetamine-dextroamphetamine (ADDERALL XR) 30 MG 24 hr capsule Take 1 capsule (30 mg total) by mouth daily. 30 capsule 0  . amphetamine-dextroamphetamine (ADDERALL) 10 MG tablet Take 1 tablet (10 mg total) by mouth 2 (two) times daily. 60 tablet 0  . amphetamine-dextroamphetamine (ADDERALL) 10 MG tablet Take 1 tablet (10 mg total) by mouth 2 (two) times daily. 60 tablet 0  . amphetamine-dextroamphetamine (ADDERALL) 10 MG tablet Take 1 tablet (10 mg total) by mouth 2 (two) times daily with a meal. 60 tablet 0  . aspirin 81 MG tablet Take 81 mg by mouth daily.    . Calcium Carbonate-Vitamin D (CALCIUM 600+D) 600-200 MG-UNIT TABS Take 1 tablet by mouth daily.    . Cholecalciferol (VITAMIN D) 2000 UNITS tablet Take 2,000 Units by mouth daily.    . fish oil-omega-3 fatty acids 1000 MG capsule Take 2 g by mouth daily.    . magnesium 30 MG tablet Take 30 mg by mouth daily.    . Multiple Vitamin (MULTIVITAMIN) tablet Take 1 tablet by mouth daily.    . psyllium (METAMUCIL) 58.6 % packet Take 1  packet by mouth daily as needed.      No current facility-administered medications on file prior to visit.     BP 118/64 (BP Location: Left Arm, Patient Position: Sitting, Cuff Size: Normal)   Pulse 87   Temp 98.2 F (36.8 C) (Oral)   Ht 5\' 4"  (1.626 m)   Wt 134 lb 9.6 oz (61.1 kg)   SpO2 98%   BMI 23.10 kg/m     Review of Systems  Constitutional: Negative.   HENT: Negative for congestion, dental problem, hearing loss, rhinorrhea, sinus pressure, sore throat and tinnitus.   Eyes: Negative for pain, discharge and visual disturbance.  Respiratory: Negative for cough and shortness of breath.   Cardiovascular: Negative for chest pain, palpitations and leg swelling.  Gastrointestinal: Negative for abdominal  distention, abdominal pain, blood in stool, constipation, diarrhea, nausea and vomiting.  Genitourinary: Negative for difficulty urinating, dysuria, flank pain, frequency, hematuria, pelvic pain, urgency, vaginal bleeding, vaginal discharge and vaginal pain.  Musculoskeletal: Negative for arthralgias, gait problem and joint swelling.       Trigger finger left thumb  Skin: Negative for rash.  Neurological: Negative for dizziness, syncope, speech difficulty, weakness, numbness and headaches.  Hematological: Negative for adenopathy.  Psychiatric/Behavioral: Negative for agitation, behavioral problems and dysphoric mood. The patient is not nervous/anxious.        Objective:   Physical Exam  Constitutional: She is oriented to person, place, and time. She appears well-developed and well-nourished.  HENT:  Head: Normocephalic and atraumatic.  Right Ear: External ear normal.  Left Ear: External ear normal.  Mouth/Throat: Oropharynx is clear and moist.  Eyes: Conjunctivae and EOM are normal.  Neck: Normal range of motion. Neck supple. No JVD present. No thyromegaly present.  Cardiovascular: Normal rate, regular rhythm, normal heart sounds and intact distal pulses.   No murmur heard. Pulmonary/Chest: Effort normal and breath sounds normal. She has no wheezes. She has no rales.  Abdominal: Soft. Bowel sounds are normal. She exhibits no distension and no mass. There is no tenderness. There is no rebound and no guarding.  Musculoskeletal: Normal range of motion. She exhibits no edema or tenderness.  Neurological: She is alert and oriented to person, place, and time. She has normal reflexes. No cranial nerve deficit. She exhibits normal muscle tone. Coordination normal.  Skin: Skin is warm and dry. No rash noted.  Psychiatric: She has a normal mood and affect. Her behavior is normal.          Assessment & Plan:   Preventive health examination  Continue vitamin D and calcium supplement and  regular exercise program Continue annual mammogram  Consider colonoscopy and Pap and pelvic exam in one year  Rogelia Boga

## 2017-02-07 ENCOUNTER — Other Ambulatory Visit: Payer: Self-pay | Admitting: Internal Medicine

## 2017-02-07 ENCOUNTER — Encounter: Payer: Self-pay | Admitting: Internal Medicine

## 2017-02-07 DIAGNOSIS — M65319 Trigger thumb, unspecified thumb: Secondary | ICD-10-CM

## 2017-02-13 DIAGNOSIS — M65312 Trigger thumb, left thumb: Secondary | ICD-10-CM | POA: Diagnosis not present

## 2017-03-27 DIAGNOSIS — M65312 Trigger thumb, left thumb: Secondary | ICD-10-CM | POA: Diagnosis not present

## 2017-04-19 DIAGNOSIS — H5501 Congenital nystagmus: Secondary | ICD-10-CM | POA: Diagnosis not present

## 2017-04-19 DIAGNOSIS — H5502 Latent nystagmus: Secondary | ICD-10-CM | POA: Diagnosis not present

## 2017-05-13 DIAGNOSIS — Z6822 Body mass index (BMI) 22.0-22.9, adult: Secondary | ICD-10-CM | POA: Diagnosis not present

## 2017-05-13 DIAGNOSIS — Z01419 Encounter for gynecological examination (general) (routine) without abnormal findings: Secondary | ICD-10-CM | POA: Diagnosis not present

## 2017-07-09 ENCOUNTER — Encounter: Payer: Self-pay | Admitting: Internal Medicine

## 2017-07-12 ENCOUNTER — Other Ambulatory Visit: Payer: Self-pay | Admitting: Internal Medicine

## 2017-07-12 DIAGNOSIS — M79673 Pain in unspecified foot: Secondary | ICD-10-CM

## 2017-07-23 ENCOUNTER — Encounter: Payer: Self-pay | Admitting: Sports Medicine

## 2017-07-23 ENCOUNTER — Ambulatory Visit (INDEPENDENT_AMBULATORY_CARE_PROVIDER_SITE_OTHER): Payer: BLUE CROSS/BLUE SHIELD | Admitting: Sports Medicine

## 2017-07-23 ENCOUNTER — Ambulatory Visit: Payer: Self-pay

## 2017-07-23 VITALS — BP 112/76 | HR 80 | Ht 64.0 in | Wt 136.0 lb

## 2017-07-23 DIAGNOSIS — M7660 Achilles tendinitis, unspecified leg: Secondary | ICD-10-CM | POA: Insufficient documentation

## 2017-07-23 MED ORDER — NITROGLYCERIN 0.2 MG/HR TD PT24: MEDICATED_PATCH | TRANSDERMAL | 1 refills | Status: DC

## 2017-07-23 NOTE — Assessment & Plan Note (Signed)
Bilateral insertional Achilles tendon pain with only minimal change on the ultrasound.  No prior history of spondyloarthropathies or family history of this.  Seemingly has worsened over the past year since beginning CrossFit but symptoms are typically worse first thing in the morning and after immobilization with sitting.  3 weeks of rest recently has not provided any benefit.  No significant benefit with anti-inflammatories.  We will have her begin with nitroglycerin protocol, Alfredson exercises and addition of waffle heel cups to see if this is beneficial.  Additionally recommend that she wear cushioned running shoes for her activities as the stiff shoes from CrossFit may be worsening symptoms.  We will plan to follow-up with her in 6 weeks and she has she is doing.  Icewater immersion soaking recommended to be performed on a daily basis especially while after activity.  If any lack of improvement could consider further diagnostic evaluation of potential lumbar component although no back pain at this time.

## 2017-07-23 NOTE — Progress Notes (Signed)
OFFICE VISIT NOTE Caroline FellsMichael D. Delorise Shinerigby, DO  [facility] [place] [phone]  Wilmer FloorLisa A Rasmussen - 51 y.o. female MRN [id number]  Date of birth: 06/20/1966  Visit Date: 07/23/2017  PCP: Caroline Rasmussen, Peter F, MD   Referred by: Caroline Rasmussen, Peter F, MD  Caroline DakinBrandy Rasmussen, CMA acting as scribe for Dr. Berline Rasmussen.  SUBJECTIVE:   Chief Complaint  Patient presents with  . New Patient (Initial Visit)    bilateral heel pain   HPI: As below and per problem based documentation when appropriate.  Caroline Rasmussen is a new patient referred by Dr. Amador CunasKwiatkowski. She presents today with complaint of bilateral heel/achilles pain.  Pain started about 1 year ago after taking a [employer] class. The pain has been off and on over the past year but has been constant over the past couple of months She took 3 weeks off while on vacation and still got no relief from her pain.  The pain is described as stiffness and aching and is rated as 3/10.  Pain is worse when she has been sitting for awhile and gets up to start moving. She has been when doing box jumps or jumping rope.  Improves when she has been on her feet, she feels like it stretches out.  Therapies tried include : She has tried using a roller to help stretch the area out, she has tried using a frozen water bottle. She gets temporary relief. She has taken Aleve and gotten minimal relief.    Other associated symptoms include: none    Review of Systems  Constitutional: Negative for chills and fever.  HENT: Negative.   Eyes: Negative.   Respiratory: Negative for shortness of breath and wheezing.   Cardiovascular: Negative for chest pain, palpitations and leg swelling.  Gastrointestinal: Negative.   Genitourinary: Negative.   Musculoskeletal: Negative for falls.  Skin: Negative.   Neurological: Negative for dizziness, tingling and headaches.  Endo/Heme/Allergies: Bruises/bleeds easily.    Otherwise per HPI.  HISTORY &  PERTINENT PRIOR DATA:  01/05/2015 Controlled Substance Contract Signed. She reports that she has never smoked. She has never used smokeless tobacco.   Recent Labs  01/09/17 0920  HGBA1C 5.5   Medications & Allergies reviewed per EMR Patient Active Problem List   Diagnosis Date Noted  . Achilles tendon pain 07/23/2017  . ADD (attention deficit disorder) 12/21/2015  . 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 IBS 10/25/2008   Past Medical History:  Diagnosis Date  . GERD (gastroesophageal reflux disease)   . IBS (irritable bowel syndrome)    Family History  Problem Relation Age of Onset  . Hypertension Mother   . Hyperlipidemia Mother   . Diabetes Father   . Hyperlipidemia Father   . Hypertension Father    Past Surgical History:  Procedure Laterality Date  . BREAST BIOPSY     benign  . BUNIONECTOMY    . DILATION AND CURETTAGE OF UTERUS     x2 (miscarriages)   Social History   Occupational History  . Not on file.   Social History Main Topics  . Smoking status: Never Smoker  . Smokeless tobacco: Never Used  . Alcohol use 0.6 oz/week    1 Glasses of wine per week  . Drug use: No  . Sexual activity: Not on file    OBJECTIVE:  VS:  HT:5\' 4"  (162.6 cm)   WT:136 lb (61.7 kg)  BMI:23.4    BP:112/76  HR:80bpm  TEMP: ( )  RESP:96 % EXAM: Findings:  WDWN, NAD, Non-toxic appearing Alert & appropriately interactive Not depressed or anxious appearing No increased work of breathing. Pupils are equal. EOM intact without nystagmus No clubbing or cyanosis of the extremities appreciated No significant rashes/lesions/ulcerations overlying the examined area. DP & PT pulses 2+/4.  No significant pretibial edema. Sensation intact to light touch in lower extremities.  Bilateral feet and ankles: Overall well aligned.  She has normal appearance of the Achilles tendon bilaterally.  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  She has no pain with calcaneal squeeze test.  Dorsiflexion is to 105 degrees bilaterally.  She has a stable ankle exam.  Intrinsic ankle strength is normal.  She does have some splay toe and a moderate longitudinal arch with weightbearing that does not collapse.     [place] Limited Joint Space Structures Low Bilat(no Linked Charges)  Result Date: 07/23/2017 Andrena Mews, DO     07/24/2017  6:54 AM LIMITED MSK ULTRASOUND OF Bilateral Heels Images were obtained and interpreted by myself, Caroline Bidding, DO Images have been saved and stored to PACS system. Images obtained on: GE S7 Ultrasound machine FINDINGS:  Bilateral Achilles tendon with overall normal fibrillar structure with a small amount of hypoechoic change and fragmentation at the insertion at the calcaneus.  There is minimal neovascularity in this area and hypoechoic changes worse on the left compared to the right but this is mild. IMPRESSION: 1. Mild insertional Achilles tendinitis   ASSESSMENT & PLAN:     ICD-10-CM   1. Achilles tendon pain M76.60 [place] LIMITED JOINT SPACE STRUCTURES LOW BILAT(NO LINKED CHARGES)  ================================================================= Achilles tendon pain Bilateral insertional Achilles tendon pain with only minimal change on the ultrasound.  No prior history of spondyloarthropathies or family history of this.  Seemingly has worsened over the past year since beginning CrossFit but symptoms are typically worse first thing in the morning and after immobilization with sitting.  3 weeks of rest recently has not provided any benefit.  No significant benefit with anti-inflammatories.  We will have her begin with nitroglycerin protocol, Alfredson exercises and addition of waffle heel cups to see if this is beneficial.  Additionally recommend that she wear cushioned running shoes for her activities as the stiff shoes from CrossFit may be worsening symptoms.  We will plan to  follow-up with her in 6 weeks and she has she is doing.  Icewater immersion soaking recommended to be performed on a daily basis especially while after activity.  If any lack of improvement could consider further diagnostic evaluation of potential lumbar component although no back pain at this time. =================================================================  Follow-up: Return in about 6 weeks (around 09/03/2017).   CMA/ATC served as Neurosurgeon during this visit. History, Physical, and Plan performed by medical provider. Documentation and orders reviewed and attested to.      Caroline Bidding, DO    [facility]

## 2017-07-23 NOTE — Patient Instructions (Addendum)
Nitroglycerin Protocol   Apply 1/4 nitroglycerin patch to affected area daily.  Change position of patch within the affected area every 24 hours.  You may experience a headache during the first 1-2 weeks of using the patch, these should subside.  If you experience headaches after beginning nitroglycerin patch treatment, you may take your preferred over the counter pain reliever.  Another side effect of the nitroglycerin patch is skin irritation or rash related to patch adhesive.  Please notify our office if you develop more severe headaches or rash, and stop the patch.  Tendon healing with nitroglycerin patch may require 12 to 24 weeks depending on the extent of injury.  Men should not use if taking Viagra, Cialis, or Levitra.   Do not use if you have migraines or rosacea.    Try picking up the wall for heel cups as well as getting fitted for shoes at  [employer].  Please wear your running shoes while doing cross fit.

## 2017-07-24 NOTE — Procedures (Signed)
LIMITED MSK ULTRASOUND OF Bilateral Heels Images were obtained and interpreted by myself, Gaspar BiddingMichael Rigby, DO  Images have been saved and stored to PACS system. Images obtained on: GE S7 Ultrasound machine  FINDINGS:   Bilateral Achilles tendon with overall normal fibrillar structure with a small amount of hypoechoic change and fragmentation at the insertion at the calcaneus.  There is minimal neovascularity in this area and hypoechoic changes worse on the left compared to the right but this is mild.  IMPRESSION:  1. Mild insertional Achilles tendinitis

## 2017-08-29 ENCOUNTER — Encounter: Payer: Self-pay | Admitting: Internal Medicine

## 2017-09-03 ENCOUNTER — Encounter: Payer: Self-pay | Admitting: Sports Medicine

## 2017-09-03 ENCOUNTER — Ambulatory Visit (INDEPENDENT_AMBULATORY_CARE_PROVIDER_SITE_OTHER): Payer: BLUE CROSS/BLUE SHIELD | Admitting: Sports Medicine

## 2017-09-03 VITALS — BP 106/76 | HR 77 | Ht 64.0 in | Wt 138.6 lb

## 2017-09-03 DIAGNOSIS — M7660 Achilles tendinitis, unspecified leg: Secondary | ICD-10-CM

## 2017-09-03 MED ORDER — DICLOFENAC SODIUM 2 % TD SOLN: 1.0000 "application " | Freq: Two times a day (BID) | TRANSDERMAL | 2 refills | Status: DC

## 2017-09-03 MED ORDER — DICLOFENAC SODIUM 2 % TD SOLN: 1.0000 "application " | Freq: Two times a day (BID) | TRANSDERMAL | 0 refills | Status: AC

## 2017-09-03 NOTE — Progress Notes (Signed)
OFFICE VISIT NOTE Caroline Rasmussen. [name] [phone]  Caroline Rasmussen - 51 y.o. female MRN [id number]  Date of birth: [birth year]  Visit Date: 09/03/2017  PCP: Gordy Savers, MD   Referred by: Gordy Savers, MD  Christoper Fabian, PT/ATC acting as scribe for Dr. Berline Chough.  SUBJECTIVE:   Chief Complaint  Patient presents with  . Follow-up    B Achille's pain   HPI: As below and per problem based documentation when appropriate.  Pt is a 51 y/o female presenting for 6 week follow-up of B Achille's tendon pain.  Pt states that Caroline Rasmussen doesn't feel much better at this point.  Caroline Rasmussen notes that doesn't have much pain when Caroline Rasmussen's up moving around but notes that Caroline Rasmussen con't to have quite a bit of pain when Caroline Rasmussen walks after sitting/resting for extended periods of time.  Pt states that Caroline Rasmussen con't to wear the nitroglycerin patches.  Caroline Rasmussen reports having performed the Alfredson exercises a few times/week.  Caroline Rasmussen states that does wear the heel cups when Caroline Rasmussen wears appropriate shoes.  Caroline Rasmussen has also purchased new, cushioned running shoes.  Caroline Rasmussen notes that Caroline Rasmussen continues to do her [employer] and is still able to do these classes w/o much issue.  Caroline Rasmussen notes that Caroline Rasmussen will occasionally take some Aleve but not on a regular basis.    Review of Systems  Constitutional: Negative.   HENT: Negative.   Eyes: Negative.   Respiratory: Negative.   Cardiovascular: Negative.   Gastrointestinal: Negative.   Genitourinary: Negative.   Musculoskeletal: Positive for joint pain.  Skin: Negative.   Neurological: Negative.   Endo/Heme/Allergies: Negative.   Psychiatric/Behavioral: Negative.     Otherwise per HPI.   HISTORY & PERTINENT PRIOR DATA:  Prior History reviewed and updated per electronic medical record.  Significant history, findings, studies and interim changes include:  reports that  has never smoked. Caroline Rasmussen has never used smokeless  tobacco. Recent Labs    01/09/17 0920  HGBA1C 5.5   01/05/2015 Controlled Substance Contract Signed. No problems updated.   OBJECTIVE:  VS:  HT:5\' 4"  (162.6 cm)   WT:138 lb 9.6 oz (62.9 kg)  BMI:23.78    BP:106/76  HR:77bpm  TEMP: ( )  RESP:98 %  PHYSICAL EXAM: Constitutional: WDWN, Non-toxic appearing. Psychiatric: Alert & appropriately interactive. Not depressed or anxious appearing. Respiratory: No increased work of breathing. Trachea Midline Eyes: Pupils are equal. EOM intact without nystagmus. No scleral icterus  Left Achilles: Overall significantly less painful however persistent swelling and pain.  Caroline Rasmussen has no palpable nodule and a negative Homans and Thomas test.  No significant swelling.  Pain with resisted dorsiflexion.  Dorsiflexion to 90 degrees only   ASSESSMENT & PLAN:   1. Achilles tendon pain    PLAN:  Findings:  We will trial topical anti-inflammatories and given lack of improvement with conservative measures will have her begin working with physical therapy and consider iontophoresis. Okay to discontinue nitroglycerin protocol due to the side effects. Recheck in 8 weeks for repeat ultrasound. Continue with activity modifications, Body Helix compression sleeve Add Strassburg sock at night    ++++++++++++++++++++++++++++++++++++++++++++ Orders & Meds: Orders Placed This Encounter  Procedures  . Ambulatory referral to Physical Therapy    Meds ordered this encounter  Medications  . DISCONTD: Diclofenac Sodium (PENNSAID) 2 % SOLN    Sig: Place 1 application onto the skin 2 (two) times daily.    Dispense:  112 g    Refill:  2    Home Phone      [phone] Mobile          [phone]   . Diclofenac Sodium (PENNSAID) 2 % SOLN    Sig: Place 1 application onto the skin 2 (two) times daily.    Dispense:  8 g    Refill:  0    ++++++++++++++++++++++++++++++++++++++++++++ Follow-up: Return in about 8 weeks (around 10/29/2017) for schedule PT visit at  [facility].   Pertinent documentation may be included in additional procedure notes, imaging studies, problem based documentation and patient instructions. Please see these sections of the encounter for additional information regarding this visit. CMA/ATC served as Neurosurgeon during this visit. History, Physical, and Plan performed by medical provider. Documentation and orders reviewed and attested to.      Andrena Mews, DO    [facility]

## 2017-09-03 NOTE — Patient Instructions (Signed)
Look into getting a Strassburg sock to be worn at night. We are referring you to work with Physical therapy.   Try stretching gently.  Lauren will review your exercises with you and discuss making some changes

## 2017-09-04 ENCOUNTER — Ambulatory Visit (INDEPENDENT_AMBULATORY_CARE_PROVIDER_SITE_OTHER): Payer: BLUE CROSS/BLUE SHIELD | Admitting: Physical Therapy

## 2017-09-04 DIAGNOSIS — R2689 Other abnormalities of gait and mobility: Secondary | ICD-10-CM | POA: Diagnosis not present

## 2017-09-04 DIAGNOSIS — M25571 Pain in right ankle and joints of right foot: Secondary | ICD-10-CM | POA: Diagnosis not present

## 2017-09-04 DIAGNOSIS — M25572 Pain in left ankle and joints of left foot: Secondary | ICD-10-CM

## 2017-09-04 NOTE — Therapy (Signed)
[facility] [address] [place], [place], [address] Phone: [phone]   Fax:  [phone]  Physical Therapy Evaluation  Patient Details  Name: Caroline Rasmussen MRN: [id number] Date of Birth: 1965/12/26 Referring Provider: Berline Chough  Encounter Date: 09/04/2017      PT End of Session - 09/04/17 1335    Visit Number 1   Number of Visits 12   Date for PT Re-Evaluation 10/16/17   Authorization Type BCBS   PT Start Time 1025   PT Stop Time 1115   PT Time Calculation (min) 50 min   Activity Tolerance Patient tolerated treatment well   Behavior During Therapy [facility] for tasks assessed/performed      Past Medical History:  Diagnosis Date  . GERD (gastroesophageal reflux disease)   . IBS (irritable bowel syndrome)     Past Surgical History:  Procedure Laterality Date  . BREAST BIOPSY     benign  . BUNIONECTOMY    . DILATION AND CURETTAGE OF UTERUS     x2 (miscarriages)    There were no vitals filed for this visit.       Subjective Assessment - 09/04/17 1026    Pertinent History Pt states increased pain in B achilles region. She has had pain for quite some time, with minimal relief.  She is not working at this time. She does crossfit 5 days a week. She has obtained new shoes recently ([employer]). She has not noticed significant pain relief with current interventions that she has been doing.    Limitations Walking   Patient Stated Goals Decreased pain with getting up out of chair, car,  and decreased pain with increased activity and exercise.    Currently in Pain? Yes   Pain Score 4    Pain Location Ankle   Pain Orientation Right;Left   Pain Descriptors / Indicators Burning;Tightness;Sore   Pain Type Chronic pain   Pain Onset More than a month ago   Pain Frequency Intermittent   Aggravating Factors  getting up from prolonged sitting,  increased walking, running.    Pain Relieving Factors Rest    Effect of Pain on Daily Activities  Decreased activity             [facility] PT Assessment - 09/04/17 0001      Assessment   Medical Diagnosis Bil Achilles Pain   Referring Provider Berline Chough   Next MD Visit 10/29/17   Prior Therapy no     Precautions   Precautions None     Balance Screen   Has the patient fallen in the past 6 months No     Prior Function   Level of Independence Independent     Cognition   Overall Cognitive Status Within Functional Limits for tasks assessed     ROM / Strength   AROM / PROM / Strength AROM;Strength     AROM   AROM Assessment Site Ankle   Right/Left Ankle Right;Left   Right Ankle Dorsiflexion 4   Left Ankle Dorsiflexion 4     Strength   Strength Assessment Site Ankle   Right/Left Ankle Right;Left   Right Ankle Dorsiflexion 4/5   Right Ankle Plantar Flexion 4/5   Right Ankle Inversion 4/5   Right Ankle Eversion 4/5   Left Ankle Dorsiflexion 4/5   Left Ankle Plantar Flexion 4/5   Left Ankle Inversion 4/5   Left Ankle Eversion 4/5     Flexibility   Soft Tissue Assessment /Muscle Length yes  Palpation   Palpation comment Standing foot posture: WFL,    Hypomobile STJ, with decreased DF,  Hypomobile Gastroc,  Increased pain at distal and insertional achilles bilaterally, with 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  Tightness noted in Bil Hamstrings and Bil Hip ROM as well.      Ambulation/Gait   Ambulation/Gait --  Mild toeing out, likely from decreased DF ROM.            Objective measurements completed on examination: See above findings.          [facility] Adult PT Treatment/Exercise - 09/04/17 0001      Exercises   Exercises Ankle     Modalities   Modalities --     Manual Therapy   Manual Therapy Joint mobilization;Soft tissue mobilization;Taping   Joint Mobilization Grade 3, to increase DF ROM   Kinesiotex --     Kinesiotix   Inhibit Muscle  Bil Achilles inhibition (2 , I strips)      Ankle Exercises: Stretches   Other Stretch Self ankle mob, kneeling at  wall 10 sec x10     Ankle Exercises: Supine   Other Supine Ankle Exercises AROM 4 way x20 each                PT Education - 09/04/17 1335    Education provided Yes   Education Details Initial HEP, footwear, precautions and direction for wear of heel lifts and k-tape,    Person(s) Educated Patient   Methods Explanation;Demonstration;Verbal cues;Handout   Comprehension Verbalized understanding;Need further instruction          PT Short Term Goals - 09/04/17 1346      PT SHORT TERM GOAL #1   Title Pt to be independent with initial HEP, and wearing of heel lifts 2   Time 2   Period Weeks   Status New   Target Date 09/18/17     PT SHORT TERM GOAL #2   Title Pt to demo decreased pain to 3/10 with activity    Time 2   Period Weeks   Status New   Target Date 09/18/17           PT Long Term Goals - 09/04/17 1347      PT LONG TERM GOAL #1   Title Pt to demo decreased pain in Bil Achilles region, to 0-2/10 with initial or prolonged standing and walking   Time 6   Status New   Target Date 10/16/17     PT LONG TERM GOAL #2   Title Pt to demo improved DF by at least 2 degrees bilaterally, to improve walking and squat mechanics.    Time 6   Period Weeks   Status New   Target Date 10/16/17     PT LONG TERM GOAL #3   Title Pt to be compliant with footwear and/or orthotics that is appropriate for foot type and dx.    Time 6   Period Weeks   Status New   Target Date 10/16/17                Plan - 09/04/17 1336    Clinical Impression Statement Pt presents with primary complaint of increased pain in bilateral achilles region. She has joint stiffness in ankle, and muscle tightness in gastrocs, both are limiting DF ROM. 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  She has poor stability, NMC,. She also has poor squat mechanics, due to lack of DF.  Pt has started to wear show that has increased heel rise, also  given heel lifts bilaterally today, to offload achilles. Taping done for tightness and pain, and joint mobilizations done for improving ROM. Pt will benefit from continued education on footwear, pain relief, squat mechanics, and HEP. Pt with decreased ability for full functional activity, due to pain. Pt to benefit from skilled PT to address deficits, and to improve ability for functional activity without pain.    Clinical Presentation Evolving   Clinical Decision Making Low   Rehab Potential Good   PT Frequency 2x / week   PT Duration 6 weeks   PT Treatment/Interventions ADLs/Self Care Home Management;Cryotherapy;Electrical Stimulation;Iontophoresis /ml Dexamethasone;Moist Heat;Ultrasound;Neuromuscular re-education;Balance training;Therapeutic exercise;Therapeutic activities;Functional mobility training;Stair training;Gait training;Patient/family education;Orthotic Fit/Training;Manual techniques;Passive range of motion;Taping;Dry needling   PT Next Visit Plan Manual for improving DF ROM, Eccentrics, Modalities for inflammation   PT Home Exercise Plan DF kneeling at wall;  AROM for ankle DF/PF   Recommended Other Services May benefit from Dry needling for improving DF ROM   Consulted and Agree with Plan of Care Patient      Patient will benefit from skilled therapeutic intervention in order to improve the following deficits and impairments:  Abnormal gait, Decreased activity tolerance, Decreased balance, Decreased range of motion, Decreased mobility, Decreased endurance, Hypomobility, Decreased strength, Pain, Difficulty walking, Impaired flexibility  Visit Diagnosis: Pain in left ankle and joints of left foot - Plan: PT plan of care cert/re-cert  Pain in right ankle and joints of right foot - Plan: PT plan of care cert/re-cert  Other abnormalities of gait and mobility - Plan: PT plan of care cert/re-cert     Problem List Patient Active Problem List   Diagnosis Date Noted  . Achilles  tendon pain 07/23/2017  . ADD (attention deficit disorder) 12/21/2015  . 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 IBS 10/25/2008   Sedalia Muta, PT, DPT 2:05 PM  09/04/17    [facility] [address] [place], [place], [address] Phone: [phone]   Fax:  [phone]  Name: Caroline Rasmussen MRN: [id number] Date of Birth: June 15, 1966

## 2017-09-06 ENCOUNTER — Ambulatory Visit (INDEPENDENT_AMBULATORY_CARE_PROVIDER_SITE_OTHER): Payer: BLUE CROSS/BLUE SHIELD | Admitting: Physical Therapy

## 2017-09-06 ENCOUNTER — Encounter: Payer: Self-pay | Admitting: Physical Therapy

## 2017-09-06 DIAGNOSIS — M25572 Pain in left ankle and joints of left foot: Secondary | ICD-10-CM

## 2017-09-06 DIAGNOSIS — R2689 Other abnormalities of gait and mobility: Secondary | ICD-10-CM | POA: Diagnosis not present

## 2017-09-06 DIAGNOSIS — M25571 Pain in right ankle and joints of right foot: Secondary | ICD-10-CM

## 2017-09-06 NOTE — Therapy (Signed)
[facility] [address] [place], [place], [address] Phone: [phone]   Fax:  [phone]  Physical Therapy Treatment  Patient Details  Name: Caroline Rasmussen MRN: [id number] Date of Birth: [birth year] Referring Provider: Berline Chough  Encounter Date: 09/06/2017      PT End of Session - 09/06/17 1207    Visit Number 2   Number of Visits 12   Date for PT Re-Evaluation 10/16/17   Authorization Type BCBS   PT Start Time 1014   PT Stop Time 1058   PT Time Calculation (min) 44 min   Activity Tolerance Patient tolerated treatment well   Behavior During Therapy [facility] for tasks assessed/performed      Past Medical History:  Diagnosis Date  . GERD (gastroesophageal reflux disease)   . IBS (irritable bowel syndrome)     Past Surgical History:  Procedure Laterality Date  . BREAST BIOPSY     benign  . BUNIONECTOMY    . DILATION AND CURETTAGE OF UTERUS     x2 (miscarriages)    There were no vitals filed for this visit.      Subjective Assessment - 09/06/17 1206    Subjective Pt states increased soreness during workout today. She has been wearing K-tape and heel lifts since last visit.    Currently in Pain? Yes   Pain Score 4    Pain Location Ankle   Pain Orientation Right;Left   Pain Descriptors / Indicators Burning;Aching   Pain Type Chronic pain   Pain Onset More than a month ago   Pain Frequency Intermittent                         [facility] Adult PT Treatment/Exercise - 09/06/17 0001      Iontophoresis   Type of Iontophoresis Dexamethasone   Location Bil distal achilles   Time 4 hour patch     Manual Therapy   Manual Therapy Joint mobilization;Soft tissue mobilization;Taping   Joint Mobilization Grade 3, to increase DF ROM     Ankle Exercises: Stretches   Other Stretch Self ankle mob, kneeling at wall 2 x10 bil     Ankle Exercises: Seated   Other Seated Ankle Exercises Ecc heel raises x30     Ankle  Exercises: Standing   SLS 30 sec x3 bl   Other Standing Ankle Exercises A to P weight shifts/pre gait x20 bil      Ankle Exercises: Supine   Other Supine Ankle Exercises Prone DF ROM x20                PT Education - 09/06/17 1206    Education provided Yes   Education Details On mechanics for new ther ex today, and to add Eccentrics to [employer]) Educated Patient   Methods Explanation   Comprehension Verbalized understanding          PT Short Term Goals - 09/04/17 1346      PT SHORT TERM GOAL #1   Title Pt to be independent with initial HEP, and wearing of heel lifts 2   Time 2   Period Weeks   Status New   Target Date 09/18/17     PT SHORT TERM GOAL #2   Title Pt to demo decreased pain to 3/10 with activity    Time 2   Period Weeks   Status New   Target Date 09/18/17  PT Long Term Goals - 09/04/17 1347      PT LONG TERM GOAL #1   Title Pt to demo decreased pain in Bil Achilles region, to 0-2/10 with initial or prolonged standing and walking   Time 6   Status New   Target Date 10/16/17     PT LONG TERM GOAL #2   Title Pt to demo improved DF by at least 2 degrees bilaterally, to improve walking and squat mechanics.    Time 6   Period Weeks   Status New   Target Date 10/16/17     PT LONG TERM GOAL #3   Title Pt to be compliant with footwear and/or orthotics that is appropriate for foot type and dx.    Time 6   Period Weeks   Status New   Target Date 10/16/17               Plan - 09/06/17 1208    Clinical Impression Statement Pt with no increased pain with activities today. She does have difficulty stabilizing with SLS, and has increased sway on L vs R. She was educated on improving knee posture with squat mechanics, which improved foot alignment as well. Iontophoresis done today for pain and inflammation, will assess effects next visit.    Rehab Potential Good   PT Frequency 2x / week   PT Duration 6 weeks   PT  Treatment/Interventions ADLs/Self Care Home Management;Cryotherapy;Electrical Stimulation;Iontophoresis /ml Dexamethasone;Moist Heat;Ultrasound;Neuromuscular re-education;Balance training;Therapeutic exercise;Therapeutic activities;Functional mobility training;Stair training;Gait training;Patient/family education;Orthotic Fit/Training;Manual techniques;Passive range of motion;Taping;Dry needling   PT Next Visit Plan Manual for improving DF ROM, Eccentrics, Modalities for inflammation   PT Home Exercise Plan DF kneeling at wall;  AROM for ankle DF/PF   Consulted and Agree with Plan of Care Patient      Patient will benefit from skilled therapeutic intervention in order to improve the following deficits and impairments:  Abnormal gait, Decreased activity tolerance, Decreased balance, Decreased range of motion, Decreased mobility, Decreased endurance, Hypomobility, Decreased strength, Pain, Difficulty walking, Impaired flexibility  Visit Diagnosis: Pain in left ankle and joints of left foot  Pain in right ankle and joints of right foot  Other abnormalities of gait and mobility     Problem List Patient Active Problem List   Diagnosis Date Noted  . Achilles tendon pain 07/23/2017  . ADD (attention deficit disorder) 12/21/2015  . 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 IBS 10/25/2008   Sedalia Muta, PT, DPT 12:10 PM  09/06/17    [facility] [address] [place], [place], [address] Phone: [phone]   Fax:  [phone]  Name: Caroline Rasmussen MRN: [id number] Date of Birth: 11-14-1966

## 2017-09-10 ENCOUNTER — Ambulatory Visit (INDEPENDENT_AMBULATORY_CARE_PROVIDER_SITE_OTHER): Payer: BLUE CROSS/BLUE SHIELD | Admitting: Physical Therapy

## 2017-09-10 DIAGNOSIS — R2689 Other abnormalities of gait and mobility: Secondary | ICD-10-CM

## 2017-09-10 DIAGNOSIS — M25572 Pain in left ankle and joints of left foot: Secondary | ICD-10-CM | POA: Diagnosis not present

## 2017-09-10 DIAGNOSIS — M25571 Pain in right ankle and joints of right foot: Secondary | ICD-10-CM

## 2017-09-10 NOTE — Therapy (Signed)
[facility] [address] [place], [place], [address] Phone: [phone]   Fax:  [phone]  Physical Therapy Treatment  Patient Details  Name: Caroline Rasmussen MRN: [id number] Date of Birth: [birth year] Referring Provider: Berline Chough  Encounter Date: 09/10/2017      PT End of Session - 09/10/17 1055    Visit Number 3   Number of Visits 12   Date for PT Re-Evaluation 10/16/17   Authorization Type BCBS   PT Start Time 1015   PT Stop Time 1054   PT Time Calculation (min) 39 min   Activity Tolerance Patient tolerated treatment well   Behavior During Therapy [facility] for tasks assessed/performed      Past Medical History:  Diagnosis Date  . GERD (gastroesophageal reflux disease)   . IBS (irritable bowel syndrome)     Past Surgical History:  Procedure Laterality Date  . BREAST BIOPSY     benign  . BUNIONECTOMY    . DILATION AND CURETTAGE OF UTERUS     x2 (miscarriages)    There were no vitals filed for this visit.      Subjective Assessment - 09/10/17 1013    Subjective reports feeling about the same, "no better."     Patient Stated Goals Decreased pain with getting up out of chair, car,  and decreased pain with increased activity and exercise.    Currently in Pain? Yes   Pain Score 3    Pain Location Heel   Pain Orientation Right;Left  Rt>Lt   Pain Descriptors / Indicators Burning;Aching   Pain Type Chronic pain   Pain Onset More than a month ago   Pain Frequency Intermittent   Aggravating Factors  getting up from prolonged sitting, increased walking, running   Pain Relieving Factors rest                         [facility] Adult PT Treatment/Exercise - 09/10/17 1018      Manual Therapy   Manual Therapy Soft tissue mobilization   Soft tissue mobilization bil gastroc/solues     Ankle Exercises: Aerobic   Stationary Bike L2 x 5 min     Ankle Exercises: Stretches   Soleus Stretch 3 reps;30 seconds  bil    Gastroc Stretch 3 reps;30 seconds  bil     Ankle Exercises: Standing   Other Standing Ankle Exercises A to P weight shifts/pre gait x20 bil           Trigger Point Dry Needling - 09/10/17 1054    Consent Given? Yes   Education Handout Provided Yes   Muscles Treated Lower Body Gastrocnemius;Soleus   Gastrocnemius Response Twitch response elicited;Palpable increased muscle length   Soleus Response Twitch response elicited;Palpable increased muscle length              PT Education - 09/10/17 1055    Education provided Yes   Education Details stretches; DN   Person(s) Educated Patient;Caregiver(s)   Methods Explanation;Handout;Demonstration   Comprehension Verbalized understanding;Returned demonstration          PT Short Term Goals - 09/04/17 1346      PT SHORT TERM GOAL #1   Title Pt to be independent with initial HEP, and wearing of heel lifts 2   Time 2   Period Weeks   Status New   Target Date 09/18/17     PT SHORT TERM GOAL #2   Title Pt to demo decreased pain to  3/10 with activity    Time 2   Period Weeks   Status New   Target Date 09/18/17           PT Long Term Goals - 09/04/17 1347      PT LONG TERM GOAL #1   Title Pt to demo decreased pain in Bil Achilles region, to 0-2/10 with initial or prolonged standing and walking   Time 6   Status New   Target Date 10/16/17     PT LONG TERM GOAL #2   Title Pt to demo improved DF by at least 2 degrees bilaterally, to improve walking and squat mechanics.    Time 6   Period Weeks   Status New   Target Date 10/16/17     PT LONG TERM GOAL #3   Title Pt to be compliant with footwear and/or orthotics that is appropriate for foot type and dx.    Time 6   Period Weeks   Status New   Target Date 10/16/17               Plan - 09/10/17 1055    Clinical Impression Statement Pt had good response to DN today reporting decreased tightness following needling and manual therapy.  Added  gastroc/soleus stretches to HEP to help with soreness following DN.  Will continue to benefit from PT to maximize function.   PT Treatment/Interventions ADLs/Self Care Home Management;Cryotherapy;Electrical Stimulation;Iontophoresis /ml Dexamethasone;Moist Heat;Ultrasound;Neuromuscular re-education;Balance training;Therapeutic exercise;Therapeutic activities;Functional mobility training;Stair training;Gait training;Patient/family education;Orthotic Fit/Training;Manual techniques;Passive range of motion;Taping;Dry needling   PT Next Visit Plan Manual for improving DF ROM, Eccentrics, Modalities for inflammation   Consulted and Agree with Plan of Care Patient      Patient will benefit from skilled therapeutic intervention in order to improve the following deficits and impairments:  Abnormal gait, Decreased activity tolerance, Decreased balance, Decreased range of motion, Decreased mobility, Decreased endurance, Hypomobility, Decreased strength, Pain, Difficulty walking, Impaired flexibility  Visit Diagnosis: Pain in left ankle and joints of left foot  Pain in right ankle and joints of right foot  Other abnormalities of gait and mobility     Problem List Patient Active Problem List   Diagnosis Date Noted  . Achilles tendon pain 07/23/2017  . ADD (attention deficit disorder) 12/21/2015  . 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 IBS 10/25/2008      Clarita Crane, PT, DPT 09/10/17 10:57 AM      [facility] [address] [place], [place], [address] Phone: [phone]   Fax:  [phone]  Name: Caroline Rasmussen MRN: [id number] Date of Birth: [birth year]

## 2017-09-10 NOTE — Patient Instructions (Addendum)
Calf / Gastoc: Runners' Stretch I    One leg back and straight, other forward and bent supporting weight, lean forward, gently stretching calf of back leg. Hold _20-30___ seconds. Repeat with other leg. Repeat _1-2___ times. Do _1-2___ sessions per day.   Calf / Soleus: Runners' Stretch II    One leg back and bent slightly, other forward and bent supporting weight, lean forward gently stretching calf of back leg. Hold __20-30__ seconds. Repeat with other leg. Repeat _1-2___ times. Do __1-2__ sessions per day.  Copyright  VHI. All rights reserved.   Trigger Point Dry Needling  . What is Trigger Point Dry Needling (DN)? o DN is a physical therapy technique used to treat muscle pain and dysfunction. Specifically, DN helps deactivate muscle trigger points (muscle knots).  o A thin filiform needle is used to penetrate the skin and stimulate the underlying trigger point. The goal is for a local twitch response (LTR) to occur and for the trigger point to relax. No medication of any kind is injected during the procedure.   . What Does Trigger Point Dry Needling Feel Like?  o The procedure feels different for each individual patient. Some patients report that they do not actually feel the needle enter the skin and overall the process is not painful. Very mild bleeding may occur. However, many patients feel a deep cramping in the muscle in which the needle was inserted. This is the local twitch response.   Marland Kitchen How Will I feel after the treatment? o Soreness is normal, and the onset of soreness may not occur for a few hours. Typically this soreness does not last longer than two days.  o Bruising is uncommon, however; ice can be used to decrease any possible bruising.  o In rare cases feeling tired or nauseous after the treatment is normal. In addition, your symptoms may get worse before they get better, this period will typically not last longer than 24 hours.   . What Can I do After My  Treatment? o Increase your hydration by drinking more water for the next 24 hours. o You may place ice or heat on the areas treated that have become sore, however, do not use heat on inflamed or bruised areas. Heat often brings more relief post needling. o You can continue your regular activities, but vigorous activity is not recommended initially after the treatment for 24 hours. o DN is best combined with other physical therapy such as strengthening, stretching, and other therapies.

## 2017-09-12 ENCOUNTER — Ambulatory Visit (INDEPENDENT_AMBULATORY_CARE_PROVIDER_SITE_OTHER): Payer: BLUE CROSS/BLUE SHIELD | Admitting: Physical Therapy

## 2017-09-12 DIAGNOSIS — R2689 Other abnormalities of gait and mobility: Secondary | ICD-10-CM | POA: Diagnosis not present

## 2017-09-12 DIAGNOSIS — M25572 Pain in left ankle and joints of left foot: Secondary | ICD-10-CM

## 2017-09-12 DIAGNOSIS — M25571 Pain in right ankle and joints of right foot: Secondary | ICD-10-CM | POA: Diagnosis not present

## 2017-09-12 NOTE — Therapy (Signed)
[facility] [address] [place], [place], [address] Phone: [phone]   Fax:  [phone]  Physical Therapy Treatment  Patient Details  Name: Caroline Rasmussen MRN: [id number] Date of Birth: 24-Dec-1965 Referring Provider: Berline Chough  Encounter Date: 09/12/2017      PT End of Session - 09/12/17 1106    Visit Number 4   Number of Visits 12   Date for PT Re-Evaluation 10/16/17   Authorization Type BCBS   PT Start Time 1015   PT Stop Time 1059   PT Time Calculation (min) 44 min   Activity Tolerance Patient tolerated treatment well   Behavior During Therapy [facility] for tasks assessed/performed      Past Medical History:  Diagnosis Date  . GERD (gastroesophageal reflux disease)   . IBS (irritable bowel syndrome)     Past Surgical History:  Procedure Laterality Date  . BREAST BIOPSY     benign  . BUNIONECTOMY    . DILATION AND CURETTAGE OF UTERUS     x2 (miscarriages)    There were no vitals filed for this visit.      Subjective Assessment - 09/12/17 1019    Subjective calves feel a little looser, but still having heel pain.   Patient Stated Goals Decreased pain with getting up out of chair, car,  and decreased pain with increased activity and exercise.    Currently in Pain? Yes   Pain Score 3    Pain Location Heel   Pain Orientation Right;Left  Rt>Lt   Pain Descriptors / Indicators Aching;Burning   Pain Type Chronic pain   Pain Onset More than a month ago   Pain Frequency Intermittent   Aggravating Factors  getting up from prolonged sitting, increased walking, running   Pain Relieving Factors rest                         [facility] Adult PT Treatment/Exercise - 09/12/17 1020      Manual Therapy   Manual Therapy Joint mobilization;Soft tissue mobilization;Passive ROM   Joint Mobilization Grade 3, to increase DF ROM   Soft tissue mobilization IASTM to bil gastroc/solues   Passive ROM bil dorsiflexion     Ankle Exercises: Aerobic   Stationary Bike L2 x 5 min     Ankle Exercises: Stretches   Soleus Stretch 30 seconds;2 reps  bil   Gastroc Stretch 30 seconds;2 reps  bil     Ankle Exercises: Seated   Other Seated Ankle Exercises Ecc heel raises x20   Other Seated Ankle Exercises DF PROM seated 5 x 10 sec hold bil     Ankle Exercises: Standing   SLS bil 3 x 10-30 sec on Airex                  PT Short Term Goals - 09/04/17 1346      PT SHORT TERM GOAL #1   Title Pt to be independent with initial HEP, and wearing of heel lifts 2   Time 2   Period Weeks   Status New   Target Date 09/18/17     PT SHORT TERM GOAL #2   Title Pt to demo decreased pain to 3/10 with activity    Time 2   Period Weeks   Status New   Target Date 09/18/17           PT Long Term Goals - 09/04/17 1347      PT LONG  TERM GOAL #1   Title Pt to demo decreased pain in Bil Achilles region, to 0-2/10 with initial or prolonged standing and walking   Time 6   Status New   Target Date 10/16/17     PT LONG TERM GOAL #2   Title Pt to demo improved DF by at least 2 degrees bilaterally, to improve walking and squat mechanics.    Time 6   Period Weeks   Status New   Target Date 10/16/17     PT LONG TERM GOAL #3   Title Pt to be compliant with footwear and/or orthotics that is appropriate for foot type and dx.    Time 6   Period Weeks   Status New   Target Date 10/16/17               Plan - 09/12/17 1106    Clinical Impression Statement Pt tolerated session well today noting decreased tightness following manual therapy and compliant with stretches to help decrease tightness. Making slow progress towards goals.   PT Treatment/Interventions ADLs/Self Care Home Management;Cryotherapy;Electrical Stimulation;Iontophoresis /ml Dexamethasone;Moist Heat;Ultrasound;Neuromuscular re-education;Balance training;Therapeutic exercise;Therapeutic activities;Functional mobility training;Stair  training;Gait training;Patient/family education;Orthotic Fit/Training;Manual techniques;Passive range of motion;Taping;Dry needling   PT Next Visit Plan Manual for improving DF ROM, Eccentrics, Modalities for inflammation   Consulted and Agree with Plan of Care Patient      Patient will benefit from skilled therapeutic intervention in order to improve the following deficits and impairments:  Abnormal gait, Decreased activity tolerance, Decreased balance, Decreased range of motion, Decreased mobility, Decreased endurance, Hypomobility, Decreased strength, Pain, Difficulty walking, Impaired flexibility  Visit Diagnosis: Pain in left ankle and joints of left foot  Pain in right ankle and joints of right foot  Other abnormalities of gait and mobility     Problem List Patient Active Problem List   Diagnosis Date Noted  . Achilles tendon pain 07/23/2017  . ADD (attention deficit disorder) 12/21/2015  . 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 IBS 10/25/2008      Clarita Crane, PT, DPT 09/12/17 11:09 AM     [facility] [address] [place], [place], [address] Phone: [phone]   Fax:  [phone]  Name: Caroline Rasmussen MRN: [id number] Date of Birth: Jul 23, 1966

## 2017-09-17 ENCOUNTER — Ambulatory Visit (INDEPENDENT_AMBULATORY_CARE_PROVIDER_SITE_OTHER): Payer: BLUE CROSS/BLUE SHIELD | Admitting: Physical Therapy

## 2017-09-17 DIAGNOSIS — M25571 Pain in right ankle and joints of right foot: Secondary | ICD-10-CM

## 2017-09-17 DIAGNOSIS — M25572 Pain in left ankle and joints of left foot: Secondary | ICD-10-CM | POA: Diagnosis not present

## 2017-09-17 DIAGNOSIS — R2689 Other abnormalities of gait and mobility: Secondary | ICD-10-CM | POA: Diagnosis not present

## 2017-09-17 NOTE — Therapy (Signed)
[facility] [address] [place], [place], [address] Phone: [phone]   Fax:  [phone]  Physical Therapy Treatment  Patient Details  Name: Caroline Rasmussen Astle MRN: [id number] Date of Birth: Jul 06, 1966 Referring Provider: Berline Chough  Encounter Date: 09/17/2017      PT End of Session - 09/17/17 1148    Visit Number 5   Number of Visits 12   Date for PT Re-Evaluation 10/16/17   Authorization Type BCBS   PT Start Time 1102   PT Stop Time 1145   PT Time Calculation (min) 43 min   Activity Tolerance Patient tolerated treatment well   Behavior During Therapy [facility] for tasks assessed/performed      Past Medical History:  Diagnosis Date  . GERD (gastroesophageal reflux disease)   . IBS (irritable bowel syndrome)     Past Surgical History:  Procedure Laterality Date  . BREAST BIOPSY     benign  . BUNIONECTOMY    . DILATION AND CURETTAGE OF UTERUS     x2 (miscarriages)    There were no vitals filed for this visit.      Subjective Assessment - 09/17/17 1104    Subjective continues to have tightness; and it happens even with short periods of sitting as well as with exercise.   Patient Stated Goals Decreased pain with getting up out of chair, car,  and decreased pain with increased activity and exercise.    Pain Score 0-No pain                         [facility] Adult PT Treatment/Exercise - 09/17/17 1107      Ambulation/Gait   Gait Comments decreased Rt heel pain following DN and manual therapy     Manual Therapy   Manual Therapy Joint mobilization;Soft tissue mobilization;Passive ROM;Myofascial release   Joint Mobilization Grade 3, to increase DF ROM   Soft tissue mobilization IASTM to Rt gastroc/solues   Myofascial Release Rt gastroc/soleus     Ankle Exercises: Aerobic   Stationary Bike L3x5 min          Trigger Point Dry Needling - 09/17/17 1147    Consent Given? Yes   Muscles Treated Lower Body  Gastrocnemius;Soleus  Rt only   Gastrocnemius Response Twitch response elicited;Palpable increased muscle length   Soleus Response Twitch response elicited;Palpable increased muscle length                PT Short Term Goals - 09/17/17 1149      PT SHORT TERM GOAL #1   Title Pt to be independent with initial HEP, and wearing of heel lifts 2   Status On-going     PT SHORT TERM GOAL #2   Title Pt to demo decreased pain to 3/10 with activity    Status On-going           PT Long Term Goals - 09/04/17 1347      PT LONG TERM GOAL #1   Title Pt to demo decreased pain in Bil Achilles region, to 0-2/10 with initial or prolonged standing and walking   Time 6   Status New   Target Date 10/16/17     PT LONG TERM GOAL #2   Title Pt to demo improved DF by at least 2 degrees bilaterally, to improve walking and squat mechanics.    Time 6   Period Weeks   Status New   Target Date 10/16/17  PT LONG TERM GOAL #3   Title Pt to be compliant with footwear and/or orthotics that is appropriate for foot type and dx.    Time 6   Period Weeks   Status New   Target Date 10/16/17               Plan - 09/17/17 1149    Clinical Impression Statement Pt continues to c/o increased tightness especially with working out as well as heel pain and tightness even with short periods of sitting.  DN performed again as well as IASTM to Rt gastroc/solues to address continued tightness.  If pt continues to report short term relief with min long term benefit may need to follow up with MD sooner.     PT Treatment/Interventions ADLs/Self Care Home Management;Cryotherapy;Electrical Stimulation;Iontophoresis /ml Dexamethasone;Moist Heat;Ultrasound;Neuromuscular re-education;Balance training;Therapeutic exercise;Therapeutic activities;Functional mobility training;Stair training;Gait training;Patient/family education;Orthotic Fit/Training;Manual techniques;Passive range of motion;Taping;Dry needling    PT Next Visit Plan Manual for improving DF ROM, Eccentrics, Modalities for inflammation   PT Home Exercise Plan DF kneeling at wall;  AROM for ankle DF/PF   Consulted and Agree with Plan of Care Patient      Patient will benefit from skilled therapeutic intervention in order to improve the following deficits and impairments:  Abnormal gait, Decreased activity tolerance, Decreased balance, Decreased range of motion, Decreased mobility, Decreased endurance, Hypomobility, Decreased strength, Pain, Difficulty walking, Impaired flexibility  Visit Diagnosis: Pain in left ankle and joints of left foot  Pain in right ankle and joints of right foot  Other abnormalities of gait and mobility     Problem List Patient Active Problem List   Diagnosis Date Noted  . Achilles tendon pain 07/23/2017  . ADD (attention deficit disorder) 12/21/2015  . 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 IBS 10/25/2008     Clarita Crane, PT, DPT 09/17/17 11:51 AM    [facility] [address] [place], [place], [address] Phone: [phone]   Fax:  [phone]  Name: Hadassa Cermak Wigger MRN: [id number] Date of Birth: October 07, 1966

## 2017-09-19 ENCOUNTER — Ambulatory Visit (INDEPENDENT_AMBULATORY_CARE_PROVIDER_SITE_OTHER): Payer: BLUE CROSS/BLUE SHIELD | Admitting: Physical Therapy

## 2017-09-19 DIAGNOSIS — M25571 Pain in right ankle and joints of right foot: Secondary | ICD-10-CM | POA: Diagnosis not present

## 2017-09-19 DIAGNOSIS — R2689 Other abnormalities of gait and mobility: Secondary | ICD-10-CM

## 2017-09-19 DIAGNOSIS — M25572 Pain in left ankle and joints of left foot: Secondary | ICD-10-CM

## 2017-09-19 NOTE — Therapy (Signed)
[facility] [address] [place], [place], [address] Phone: [phone]   Fax:  [phone]  Physical Therapy Treatment  Patient Details  Name: Caroline Rasmussen MRN: [id number] Date of Birth: 1966-04-29 Referring Provider: Berline Chough  Encounter Date: 09/19/2017      PT End of Session - 09/19/17 1058    Visit Number 6   Number of Visits 12   Date for PT Re-Evaluation 10/16/17   Authorization Type BCBS   PT Start Time 1015   PT Stop Time 1055   PT Time Calculation (min) 40 min   Activity Tolerance Patient tolerated treatment well   Behavior During Therapy [facility] for tasks assessed/performed      Past Medical History:  Diagnosis Date  . GERD (gastroesophageal reflux disease)   . IBS (irritable bowel syndrome)     Past Surgical History:  Procedure Laterality Date  . BREAST BIOPSY     benign  . BUNIONECTOMY    . DILATION AND CURETTAGE OF UTERUS     x2 (miscarriages)    There were no vitals filed for this visit.      Subjective Assessment - 09/19/17 1016    Subjective felt great after the last session, but then calves tightened right back up.  not seeing any long-term benefit.     Patient Stated Goals Decreased pain with getting up out of chair, car,  and decreased pain with increased activity and exercise.    Pain Score 0-No pain            [facility] PT Assessment - 09/19/17 1021      Strength   Strength Assessment Site Hip   Right/Left Hip Right;Left   Right Hip Flexion 5/5   Right Hip Extension 4/5   Right Hip External Rotation  3/5   Right Hip Internal Rotation 5/5   Right Hip ABduction 3+/5   Right Hip ADduction 4/5   Left Hip Flexion 3/5   Left Hip Extension 3/5   Left Hip External Rotation 3/5   Left Hip Internal Rotation 4/5   Left Hip ABduction 3/5   Left Hip ADduction 5/5                     [facility] Adult PT Treatment/Exercise - 09/19/17 1019      Exercises   Exercises Knee/Hip     Knee/Hip Exercises: Standing   Hip Flexion Both;15 reps;Knee straight   Hip Flexion Limitations green theraband   Hip ADduction Both;15 reps   Hip ADduction Limitations green theraband   Hip Abduction Both;15 reps;Knee straight   Abduction Limitations green theraband   Hip Extension Both;15 reps;Knee straight   Extension Limitations green theraband   Other Standing Knee Exercises isometric hip abdct into wall 3x15 sec bil   Other Standing Knee Exercises hip hike with orange physioball x 15 bil     Knee/Hip Exercises: Seated   Other Seated Knee/Hip Exercises hip ir/er x 15 reps each; bil; green theraband     Ankle Exercises: Aerobic   Stationary Bike L3x5 min                PT Education - 09/19/17 1058    Education provided Yes   Education Details hip strengthening HEP   Person(s) Educated Patient   Methods Explanation;Demonstration;Handout   Comprehension Verbalized understanding;Returned demonstration          PT Short Term Goals - 09/17/17 1149      PT SHORT TERM GOAL #  1   Title Pt to be independent with initial HEP, and wearing of heel lifts 2   Status On-going     PT SHORT TERM GOAL #2   Title Pt to demo decreased pain to 3/10 with activity    Status On-going           PT Long Term Goals - 09/04/17 1347      PT LONG TERM GOAL #1   Title Pt to demo decreased pain in Bil Achilles region, to 0-2/10 with initial or prolonged standing and walking   Time 6   Status New   Target Date 10/16/17     PT LONG TERM GOAL #2   Title Pt to demo improved DF by at least 2 degrees bilaterally, to improve walking and squat mechanics.    Time 6   Period Weeks   Status New   Target Date 10/16/17     PT LONG TERM GOAL #3   Title Pt to be compliant with footwear and/or orthotics that is appropriate for foot type and dx.    Time 6   Period Weeks   Status New   Target Date 10/16/17               Plan - 09/19/17 1244    Clinical Impression Statement Pt  reports short term benefits with each session, but tightness quickly returns and overall no long term improvements have been noticed.  Pt demonstrated increased hip weakness so address this today with HEP to work on strength and stability.  Advised pt to decrease intensity of workouts, especially when calves are involved for now and see if she notices improvement.   PT Treatment/Interventions ADLs/Self Care Home Management;Cryotherapy;Electrical Stimulation;Iontophoresis /ml Dexamethasone;Moist Heat;Ultrasound;Neuromuscular re-education;Balance training;Therapeutic exercise;Therapeutic activities;Functional mobility training;Stair training;Gait training;Patient/family education;Orthotic Fit/Training;Manual techniques;Passive range of motion;Taping;Dry needling   PT Next Visit Plan rewiew hip HEP, continue stability exercises and manual/modalities PRN   Consulted and Agree with Plan of Care Patient      Patient will benefit from skilled therapeutic intervention in order to improve the following deficits and impairments:  Abnormal gait, Decreased activity tolerance, Decreased balance, Decreased range of motion, Decreased mobility, Decreased endurance, Hypomobility, Decreased strength, Pain, Difficulty walking, Impaired flexibility  Visit Diagnosis: Pain in left ankle and joints of left foot  Pain in right ankle and joints of right foot  Other abnormalities of gait and mobility     Problem List Patient Active Problem List   Diagnosis Date Noted  . Achilles tendon pain 07/23/2017  . ADD (attention deficit disorder) 12/21/2015  . 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 IBS 10/25/2008      Clarita Crane, PT, DPT 09/19/17 12:53 PM    [facility] [address] [place], [place], [address] Phone: [phone]   Fax:  [phone]  Name: Cerria Randhawa Kleeman MRN: [id number] Date of Birth: Aug 16, 1966

## 2017-09-19 NOTE — Patient Instructions (Signed)
ABDUCTION: Standing - Resistance Band (Active)   Stand, feet flat. Against green resistance band, lift right leg out to side.  Repeat with other leg. Complete __1_ sets of _15__ repetitions. Perform _1-2__ sessions per day.  ADDUCTION: Standing - Stable: Resistance Band (Active)   Stand, right leg out to side as far as possible. Against green resistance band, draw leg in across midline.  Repeat with other leg. Complete _1__ sets of __15_ repetitions. Perform _1-2__ sessions per day.  Strengthening: Hip Flexion - Resisted   With tubing around left ankle, anchor behind, bring leg forward, keeping knee straight.  Repeat with other leg. Repeat __15__ times per set. Do __1__ sets per session. Do __1-2__ sessions per day.  Strengthening: Hip Extension - Resisted   With tubing around right ankle, face anchor and pull leg straight back.  Repeat with other leg. Repeat _15___ times per set. Do __1__ sets per session. Do _1-2___ sessions per day.  INTERNAL ROTATION: Sitting - Resistance Band (Active)   Sit, feet flat. Lift right leg slightly and, against green resistance band, move foot outward.  Repeat with other leg. Complete _1__ sets of _15__ repetitions. Perform _1-2__ sessions per day.  EXTERNAL ROTATION: Sitting - Resistance Band (Active)   Sit, feet flat. Lift right knee and, against green resistance band, move foot inward toward opposite knee.  Repeat with other leg. Complete __1_ sets of _15__ repetitions. Perform _1-2__ sessions per day.

## 2017-09-30 ENCOUNTER — Other Ambulatory Visit: Payer: Self-pay | Admitting: Sports Medicine

## 2017-10-02 ENCOUNTER — Ambulatory Visit (INDEPENDENT_AMBULATORY_CARE_PROVIDER_SITE_OTHER): Payer: BLUE CROSS/BLUE SHIELD | Admitting: Physical Therapy

## 2017-10-02 DIAGNOSIS — M25571 Pain in right ankle and joints of right foot: Secondary | ICD-10-CM

## 2017-10-02 DIAGNOSIS — R2689 Other abnormalities of gait and mobility: Secondary | ICD-10-CM | POA: Diagnosis not present

## 2017-10-02 DIAGNOSIS — M25572 Pain in left ankle and joints of left foot: Secondary | ICD-10-CM

## 2017-10-02 NOTE — Therapy (Addendum)
[facility] [address] [place], [place], [address] Phone: [phone]   Fax:  [phone]  Physical Therapy Treatment/Discharge  Patient Details  Name: Caroline Rasmussen MRN: [id number] Date of Birth: 12/03/1966 Referring Provider: [place]  Encounter Date: 10/02/2017      PT End of Session - 10/02/17 1206    Visit Number 7   Number of Visits 12   Date for PT Re-Evaluation 10/16/17   Authorization Type BCBS   PT Start Time 1101   PT Stop Time 1144   PT Time Calculation (min) 43 min   Activity Tolerance Patient tolerated treatment well   Behavior During Therapy [facility] for tasks assessed/performed      Past Medical History:  Diagnosis Date  . GERD (gastroesophageal reflux disease)   . IBS (irritable bowel syndrome)     Past Surgical History:  Procedure Laterality Date  . BREAST BIOPSY     benign  . BUNIONECTOMY    . DILATION AND CURETTAGE OF UTERUS     x2 (miscarriages)    There were no vitals filed for this visit.      Subjective Assessment - 10/02/17 1104    Subjective feels about the same overall; ran a little today (4 x 400m) and is still having some pain and tenderness.  feels flexibility in ankle is better; and hip exercises seem to be helping.   Patient Stated Goals Decreased pain with getting up out of chair, car,  and decreased pain with increased activity and exercise.    Pain Score 0-No pain  up to 2/10            [facility] PT Assessment - 10/02/17 1115      AROM   Right Ankle Dorsiflexion 2  past neutral; 14 in weightbearing   Left Ankle Dorsiflexion 6  past neutral; 21 in weightbearing                     [facility] Adult PT Treatment/Exercise - 10/02/17 1106      Self-Care   Self-Care Other Self-Care Comments   Other Self-Care Comments  discussed foot wear, orthotics and running form with pt.  also discussed current progress and POC - plan to hold PT until pt sees MD due to minimal long  term benefit     Knee/Hip Exercises: Standing   Wall Squat 10 reps;10 seconds   Wall Squat Limitations with strap for isometric hip abduction     Knee/Hip Exercises: Sidelying   Other Sidelying Knee/Hip Exercises side plank with hip abduction x 2 bil     Ankle Exercises: Aerobic   Stationary Bike L3 x 5 min                PT Education - 10/02/17 1206    Education provided Yes   Education Details see self care   Person(s) Educated Patient   Methods Explanation;Demonstration;Handout   Comprehension Verbalized understanding;Returned demonstration          PT Short Term Goals - 09/17/17 1149      PT SHORT TERM GOAL #1   Title Pt to be independent with initial HEP, and wearing of heel lifts 2   Status On-going     PT SHORT TERM GOAL #2   Title Pt to demo decreased pain to 3/10 with activity    Status On-going           PT Long Term Goals - 10/02/17 1207        PT LONG TERM GOAL #1   Title Pt to demo decreased pain in Bil Achilles region, to 0-2/10 with initial or prolonged standing and walking   Status Achieved     PT LONG TERM GOAL #2   Title Pt to demo improved DF by at least 2 degrees bilaterally, to improve walking and squat mechanics.    Baseline 10/24: met on Lt; not on Rt   Status Partially Met     PT LONG TERM GOAL #3   Title Pt to be compliant with footwear and/or orthotics that is appropriate for foot type and dx.    Status Achieved               Plan - 10/02/17 1207    Clinical Impression Statement Pt has met 2/3 LTGs and partially met ROM goal.  ROM in weightbearing improved and pt feels overall flexibility is improving.  Expressed no significant carryover during sessions with limited overall benefit.  At this time pt to continue with HEP and work on strengthening, and will hold PT until after pt sees DO.  Pt in agreement with this plan.   PT Treatment/Interventions ADLs/Self Care Home Management;Cryotherapy;Electrical  Stimulation;Iontophoresis 67m/ml Dexamethasone;Moist Heat;Ultrasound;Neuromuscular re-education;Balance training;Therapeutic exercise;Therapeutic activities;Functional mobility training;Stair training;Gait training;Patient/family education;Orthotic Fit/Training;Manual techniques;Passive range of motion;Taping;Dry needling   PT Next Visit Plan hold PT, d/c if pt doesn't return, if pt does return reassess   Consulted and Agree with Plan of Care Patient      Patient will benefit from skilled therapeutic intervention in order to improve the following deficits and impairments:  Abnormal gait, Decreased activity tolerance, Decreased balance, Decreased range of motion, Decreased mobility, Decreased endurance, Hypomobility, Decreased strength, Pain, Difficulty walking, Impaired flexibility  Visit Diagnosis: Pain in left ankle and joints of left foot  Pain in right ankle and joints of right foot  Other abnormalities of gait and mobility     Problem List Patient Active Problem List   Diagnosis Date Noted  . Achilles tendon pain 07/23/2017  . ADD (attention deficit disorder) 12/21/2015  . Factor V deficiency ([place] 12/27/2011  . IBS 10/25/2008      SLaureen Rasmussen PT, DPT 10/02/17 12:10 PM   [place] [address]Phone: [phone]  Fax:  [phone] Name: LMirtie BastyrZimmerschied MRN: [id number] of Birth: [birth year]    PHYSICAL THERAPY DISCHARGE SUMMARY  Visits from Start of Care: 7  Current functional level related to goals / functional outcomes: See above   Remaining deficits: See above; improvements made during sessions, but virtually no carryover between sessions   Education / Equipment: HEP  Plan: Patient agrees to discharge.  Patient goals were not met. Patient is being discharged due to lack of progress.  ?????    SLaureen Rasmussen PT, DPT 11/04/17 8:56 AM   [place] [address]Phone: [phone] Fax: [phone]

## 2017-10-02 NOTE — Patient Instructions (Signed)
   Abductor Strength: Chair Pose (Strap, Wall)    Make strap wide enough to brace knees at hip width. Press into strap with knees. Hold for __5-10__ seconds. Repeat __10-15__ times.   Abductor Strength: Side Plank Pose, on Knees    Press down with bottom knee to lift hips. Hold for _10-15___ seconds. Repeat _3-5___ times each side.   Gluteus Medius Strength: Wall Push    Bend uninvolved knee up and press against wall. Bend involved knee slightly and squeeze buttocks.  Hold for 5 seconds. Repeat __10-15__ times.  Do ___1-2_ sessions per day.   Ball to Wall Leg Lift    Tighten outer hip and lift inner leg. Keep pelvis level. Lift _10-15__ times. Repeat on other side. Do _1-2__ times per day.

## 2017-10-08 DIAGNOSIS — Z1231 Encounter for screening mammogram for malignant neoplasm of breast: Secondary | ICD-10-CM | POA: Diagnosis not present

## 2017-10-29 ENCOUNTER — Ambulatory Visit: Payer: BLUE CROSS/BLUE SHIELD | Admitting: Sports Medicine

## 2017-11-15 ENCOUNTER — Encounter: Payer: Self-pay | Admitting: Sports Medicine

## 2017-11-18 ENCOUNTER — Ambulatory Visit: Payer: BLUE CROSS/BLUE SHIELD | Admitting: Sports Medicine

## 2018-01-01 DIAGNOSIS — L821 Other seborrheic keratosis: Secondary | ICD-10-CM | POA: Diagnosis not present

## 2018-01-01 DIAGNOSIS — D2272 Melanocytic nevi of left lower limb, including hip: Secondary | ICD-10-CM | POA: Diagnosis not present

## 2018-01-01 DIAGNOSIS — L738 Other specified follicular disorders: Secondary | ICD-10-CM | POA: Diagnosis not present

## 2018-01-01 DIAGNOSIS — L720 Epidermal cyst: Secondary | ICD-10-CM | POA: Diagnosis not present

## 2018-03-02 DIAGNOSIS — M25572 Pain in left ankle and joints of left foot: Secondary | ICD-10-CM | POA: Diagnosis not present

## 2018-03-02 DIAGNOSIS — S92912A Unspecified fracture of left toe(s), initial encounter for closed fracture: Secondary | ICD-10-CM | POA: Diagnosis not present

## 2018-03-02 DIAGNOSIS — M79672 Pain in left foot: Secondary | ICD-10-CM | POA: Diagnosis not present

## 2018-03-04 DIAGNOSIS — M25572 Pain in left ankle and joints of left foot: Secondary | ICD-10-CM | POA: Diagnosis not present

## 2018-03-04 DIAGNOSIS — S92355A Nondisplaced fracture of fifth metatarsal bone, left foot, initial encounter for closed fracture: Secondary | ICD-10-CM | POA: Diagnosis not present

## 2018-04-08 DIAGNOSIS — S92355D Nondisplaced fracture of fifth metatarsal bone, left foot, subsequent encounter for fracture with routine healing: Secondary | ICD-10-CM | POA: Diagnosis not present

## 2018-08-06 ENCOUNTER — Encounter: Payer: Self-pay | Admitting: Internal Medicine

## 2018-08-06 ENCOUNTER — Ambulatory Visit (INDEPENDENT_AMBULATORY_CARE_PROVIDER_SITE_OTHER): Payer: BLUE CROSS/BLUE SHIELD | Admitting: Internal Medicine

## 2018-08-06 VITALS — BP 114/78 | HR 75 | Temp 98.6°F | Ht 64.0 in | Wt 141.8 lb

## 2018-08-06 DIAGNOSIS — Z0000 Encounter for general adult medical examination without abnormal findings: Secondary | ICD-10-CM

## 2018-08-06 LAB — COMPREHENSIVE METABOLIC PANEL
ALK PHOS: 64 U/L (ref 39–117)
ALT: 16 U/L (ref 0–35)
AST: 17 U/L (ref 0–37)
Albumin: 4.8 g/dL (ref 3.5–5.2)
BILIRUBIN TOTAL: 0.6 mg/dL (ref 0.2–1.2)
BUN: 15 mg/dL (ref 6–23)
CALCIUM: 9.4 mg/dL (ref 8.4–10.5)
CO2: 26 mEq/L (ref 19–32)
Chloride: 103 mEq/L (ref 96–112)
Creatinine, Ser: 0.81 mg/dL (ref 0.40–1.20)
GFR: 78.74 mL/min (ref >60.00)
Glucose, Bld: 103 mg/dL — ABNORMAL HIGH (ref 70–99)
POTASSIUM: 4.3 meq/L (ref 3.5–5.1)
Sodium: 139 mEq/L (ref 135–145)
TOTAL PROTEIN: 7.3 g/dL (ref 6.0–8.3)

## 2018-08-06 LAB — LIPID PANEL
Cholesterol: 244 mg/dL — ABNORMAL HIGH (ref 0–200)
HDL: 78.6 mg/dL (ref >39.00)
LDL Cholesterol: 137 mg/dL — ABNORMAL HIGH (ref 0–99)
NonHDL: 165.47
TRIGLYCERIDES: 140 mg/dL (ref 0.0–149.0)
Total CHOL/HDL Ratio: 3
VLDL: 28 mg/dL (ref 0.0–40.0)

## 2018-08-06 LAB — TSH: TSH: 2.31 u[IU]/mL (ref 0.35–4.50)

## 2018-08-06 LAB — CBC WITH DIFFERENTIAL/PLATELET
BASOS ABS: 0 10*3/uL (ref 0.0–0.1)
Basophils Relative: 0.7 % (ref 0.0–3.0)
Eosinophils Absolute: 0.2 10*3/uL (ref 0.0–0.7)
Eosinophils Relative: 3.7 % (ref 0.0–5.0)
HEMATOCRIT: 41.5 % (ref 36.0–46.0)
HEMOGLOBIN: 14 g/dL (ref 12.0–15.0)
LYMPHS PCT: 33.6 % (ref 12.0–46.0)
Lymphs Abs: 1.8 10*3/uL (ref 0.7–4.0)
MCHC: 33.8 g/dL (ref 30.0–36.0)
MCV: 90 fl (ref 78.0–100.0)
MONOS PCT: 11 % (ref 3.0–12.0)
Monocytes Absolute: 0.6 10*3/uL (ref 0.1–1.0)
Neutro Abs: 2.7 10*3/uL (ref 1.4–7.7)
Neutrophils Relative %: 51 % (ref 43.0–77.0)
Platelets: 279 10*3/uL (ref 150.0–400.0)
RBC: 4.61 Mil/uL (ref 3.87–5.11)
RDW: 13.1 % (ref 11.5–15.5)
WBC: 5.3 10*3/uL (ref 4.0–10.5)

## 2018-08-06 NOTE — Progress Notes (Signed)
Subjective:    Patient ID: Caroline Rasmussen, female    DOB: 13-Oct-1966, 52 y.o.   MRN: [id number]  HPI  52 year old patient who is seen today for a preventive health examination She is followed annually by gynecology with annual mammograms. She has a prior history of IBS and did have a colonoscopy performed 10 years ago  She has a history of factor V Leiden deficiency, history of multiple miscarriages but no prior history of DVT.  She is on daily low-dose aspirin  No concerns or complaints  She has had a number of orthopedic issues last year that have all resolved  Family history  Mother died within the past year age 17 from complications of an acute MI.  She had prior history of PAD hypertension and dyslipidemia Father age 63 history of dyslipidemia hypertension and diabetes 2 sisters are well  Past Medical History:  Diagnosis Date  . GERD (gastroesophageal reflux disease)   . IBS (irritable bowel syndrome)      Social History   Socioeconomic History  . Marital status: Married    Spouse name: Not on file  . Number of children: Not on file  . Years of education: Not on file  . Highest education level: Not on file  Occupational History  . Not on file  Social Needs  . Financial resource strain: Not on file  . Food insecurity:    Worry: Not on file    Inability: Not on file  . Transportation needs:    Medical: Not on file    Non-medical: Not on file  Tobacco Use  . Smoking status: Never Smoker  . Smokeless tobacco: Never Used  Substance and Sexual Activity  . Alcohol use: Yes    Alcohol/week: 1.0 standard drinks    Types: 1 Glasses of wine per week  . Drug use: No  . Sexual activity: Not on file  Lifestyle  . Physical activity:    Days per week: Not on file    Minutes per session: Not on file  . Stress: Not on file  Relationships  . Social connections:    Talks on phone: Not on file    Gets together: Not on file    Attends religious service: Not on  file    Active member of club or organization: Not on file    Attends meetings of clubs or organizations: Not on file    Relationship status: Not on file  . Intimate partner violence:    Fear of current or ex partner: Not on file    Emotionally abused: Not on file    Physically abused: Not on file    Forced sexual activity: Not on file  Other Topics Concern  . Not on file  Social History Narrative  . Not on file    Past Surgical History:  Procedure Laterality Date  . BREAST BIOPSY     benign  . BUNIONECTOMY    . DILATION AND CURETTAGE OF UTERUS     x2 (miscarriages)    Family History  Problem Relation Age of Onset  . Hypertension Mother   . Hyperlipidemia Mother   . Diabetes Father   . Hyperlipidemia Father   . Hypertension Father     Allergies  Allergen Reactions  . Erythromycin Base     REACTION: vomiting  . Oxymorphone Rash    Current Outpatient Medications on File Prior to Visit  Medication Sig Dispense Refill  . aspirin 81 MG tablet Take 81 mg  by mouth daily.    . Calcium Carbonate-Vitamin D (CALCIUM 600+D) 600-200 MG-UNIT TABS Take 1 tablet by mouth daily.    . Cholecalciferol (VITAMIN D) 2000 UNITS tablet Take 2,000 Units by mouth daily.    . fish oil-omega-3 fatty acids 1000 MG capsule Take 2 g by mouth daily.    . magnesium 30 MG tablet Take 30 mg by mouth daily.    . Multiple Vitamin (MULTIVITAMIN) tablet Take 1 tablet by mouth daily.     No current facility-administered medications on file prior to visit.     BP 114/78 (BP Location: Right Arm, Patient Position: Sitting, Cuff Size: Normal)   Pulse 75   Temp 98.6 F (37 C) (Oral)   Ht 5\' 4"  (1.626 m)   Wt 141 lb 12.8 oz (64.3 kg)   SpO2 99%   BMI 24.34 kg/m       Review of Systems  Constitutional: Negative.   HENT: Negative for congestion, dental problem, hearing loss, rhinorrhea, sinus pressure, sore throat and tinnitus.   Eyes: Negative for pain, discharge and visual disturbance.    Respiratory: Negative for cough and shortness of breath.   Cardiovascular: Negative for chest pain, palpitations and leg swelling.  Gastrointestinal: Negative for abdominal distention, abdominal pain, blood in stool, constipation, diarrhea, nausea and vomiting.  Genitourinary: Negative for difficulty urinating, dysuria, flank pain, frequency, hematuria, pelvic pain, urgency, vaginal bleeding, vaginal discharge and vaginal pain.  Musculoskeletal: Negative for arthralgias, gait problem and joint swelling.  Skin: Negative for rash.  Neurological: Negative for dizziness, syncope, speech difficulty, weakness, numbness and headaches.  Hematological: Negative for adenopathy.  Psychiatric/Behavioral: Negative for agitation, behavioral problems and dysphoric mood. The patient is not nervous/anxious.        Objective:   Physical Exam  Constitutional: She is oriented to person, place, and time. She appears well-developed and well-nourished.  HENT:  Head: Normocephalic.  Right Ear: External ear normal.  Left Ear: External ear normal.  Mouth/Throat: Oropharynx is clear and moist.  Eyes: Pupils are equal, round, and reactive to light. Conjunctivae and EOM are normal.  Neck: Normal range of motion. Neck supple. No thyromegaly present.  Cardiovascular: Normal rate, regular rhythm, normal heart sounds and intact distal pulses.  Pulmonary/Chest: Effort normal and breath sounds normal.  Abdominal: Soft. Bowel sounds are normal. She exhibits no mass. There is no tenderness.  Musculoskeletal: Normal range of motion.  Lymphadenopathy:    She has no cervical adenopathy.  Neurological: She is alert and oriented to person, place, and time.  Skin: Skin is warm and dry. No rash noted.  Psychiatric: She has a normal mood and affect. Her behavior is normal.          Assessment & Plan:   Preventive health examination Factor V Leiden deficiency History of IBS stable  We will review updated  lab Follow-up gynecology Schedule 10-year colonoscopy  Gordy Saverseter F Kwiatkowski

## 2018-08-06 NOTE — Patient Instructions (Signed)
Health Maintenance for Postmenopausal Women Menopause is a normal process in which your reproductive ability comes to an end. This process happens gradually over a span of months to years, usually between the ages of 22 and 9. Menopause is complete when you have missed 12 consecutive menstrual periods. It is important to talk with your health care provider about some of the most common conditions that affect postmenopausal women, such as heart disease, cancer, and bone loss (osteoporosis). Adopting a healthy lifestyle and getting preventive care can help to promote your health and wellness. Those actions can also lower your chances of developing some of these common conditions. What should I know about menopause? During menopause, you may experience a number of symptoms, such as:  Moderate-to-severe hot flashes.  Night sweats.  Decrease in sex drive.  Mood swings.  Headaches.  Tiredness.  Irritability.  Memory problems.  Insomnia.  Choosing to treat or not to treat menopausal changes is an individual decision that you make with your health care provider. What should I know about hormone replacement therapy and supplements? Hormone therapy products are effective for treating symptoms that are associated with menopause, such as hot flashes and night sweats. Hormone replacement carries certain risks, especially as you become older. If you are thinking about using estrogen or estrogen with progestin treatments, discuss the benefits and risks with your health care provider. What should I know about heart disease and stroke? Heart disease, heart attack, and stroke become more likely as you age. This may be due, in part, to the hormonal changes that your body experiences during menopause. These can affect how your body processes dietary fats, triglycerides, and cholesterol. Heart attack and stroke are both medical emergencies. There are many things that you can do to help prevent heart disease  and stroke:  Have your blood pressure checked at least every 1-2 years. High blood pressure causes heart disease and increases the risk of stroke.  If you are 53-22 years old, ask your health care provider if you should take aspirin to prevent a heart attack or a stroke.  Do not use any tobacco products, including cigarettes, chewing tobacco, or electronic cigarettes. If you need help quitting, ask your health care provider.  It is important to eat a healthy diet and maintain a healthy weight. ? Be sure to include plenty of vegetables, fruits, low-fat dairy products, and lean protein. ? Avoid eating foods that are high in solid fats, added sugars, or salt (sodium).  Get regular exercise. This is one of the most important things that you can do for your health. ? Try to exercise for at least 150 minutes each week. The type of exercise that you do should increase your heart rate and make you sweat. This is known as moderate-intensity exercise. ? Try to do strengthening exercises at least twice each week. Do these in addition to the moderate-intensity exercise.  Know your numbers.Ask your health care provider to check your cholesterol and your blood glucose. Continue to have your blood tested as directed by your health care provider.  What should I know about cancer screening? There are several types of cancer. Take the following steps to reduce your risk and to catch any cancer development as early as possible. Breast Cancer  Practice breast self-awareness. ? This means understanding how your breasts normally appear and feel. ? It also means doing regular breast self-exams. Let your health care provider know about any changes, no matter how small.  If you are 40  or older, have a clinician do a breast exam (clinical breast exam or CBE) every year. Depending on your age, family history, and medical history, it may be recommended that you also have a yearly breast X-ray (mammogram).  If you  have a family history of breast cancer, talk with your health care provider about genetic screening.  If you are at high risk for breast cancer, talk with your health care provider about having an MRI and a mammogram every year.  Breast cancer (BRCA) gene test is recommended for women who have family members with BRCA-related cancers. Results of the assessment will determine the need for genetic counseling and BRCA1 and for BRCA2 testing. BRCA-related cancers include these types: ? Breast. This occurs in males or females. ? Ovarian. ? Tubal. This may also be called fallopian tube cancer. ? Cancer of the abdominal or pelvic lining (peritoneal cancer). ? Prostate. ? Pancreatic.  Cervical, Uterine, and Ovarian Cancer Your health care provider may recommend that you be screened regularly for cancer of the pelvic organs. These include your ovaries, uterus, and vagina. This screening involves a pelvic exam, which includes checking for microscopic changes to the surface of your cervix (Pap test).  For women ages 21-65, health care providers may recommend a pelvic exam and a Pap test every three years. For women ages 79-65, they may recommend the Pap test and pelvic exam, combined with testing for human papilloma virus (HPV), every five years. Some types of HPV increase your risk of cervical cancer. Testing for HPV may also be done on women of any age who have unclear Pap test results.  Other health care providers may not recommend any screening for nonpregnant women who are considered low risk for pelvic cancer and have no symptoms. Ask your health care provider if a screening pelvic exam is right for you.  If you have had past treatment for cervical cancer or a condition that could lead to cancer, you need Pap tests and screening for cancer for at least 20 years after your treatment. If Pap tests have been discontinued for you, your risk factors (such as having a new sexual partner) need to be  reassessed to determine if you should start having screenings again. Some women have medical problems that increase the chance of getting cervical cancer. In these cases, your health care provider may recommend that you have screening and Pap tests more often.  If you have a family history of uterine cancer or ovarian cancer, talk with your health care provider about genetic screening.  If you have vaginal bleeding after reaching menopause, tell your health care provider.  There are currently no reliable tests available to screen for ovarian cancer.  Lung Cancer Lung cancer screening is recommended for adults 69-62 years old who are at high risk for lung cancer because of a history of smoking. A yearly low-dose CT scan of the lungs is recommended if you:  Currently smoke.  Have a history of at least 30 pack-years of smoking and you currently smoke or have quit within the past 15 years. A pack-year is smoking an average of one pack of cigarettes per day for one year.  Yearly screening should:  Continue until it has been 15 years since you quit.  Stop if you develop a health problem that would prevent you from having lung cancer treatment.  Colorectal Cancer  This type of cancer can be detected and can often be prevented.  Routine colorectal cancer screening usually begins at  age 42 and continues through age 45.  If you have risk factors for colon cancer, your health care provider may recommend that you be screened at an earlier age.  If you have a family history of colorectal cancer, talk with your health care provider about genetic screening.  Your health care provider may also recommend using home test kits to check for hidden blood in your stool.  A small camera at the end of a tube can be used to examine your colon directly (sigmoidoscopy or colonoscopy). This is done to check for the earliest forms of colorectal cancer.  Direct examination of the colon should be repeated every  5-10 years until age 71. However, if early forms of precancerous polyps or small growths are found or if you have a family history or genetic risk for colorectal cancer, you may need to be screened more often.  Skin Cancer  Check your skin from head to toe regularly.  Monitor any moles. Be sure to tell your health care provider: ? About any new moles or changes in moles, especially if there is a change in a mole's shape or color. ? If you have a mole that is larger than the size of a pencil eraser.  If any of your family members has a history of skin cancer, especially at a young age, talk with your health care provider about genetic screening.  Always use sunscreen. Apply sunscreen liberally and repeatedly throughout the day.  Whenever you are outside, protect yourself by wearing long sleeves, pants, a wide-brimmed hat, and sunglasses.  What should I know about osteoporosis? Osteoporosis is a condition in which bone destruction happens more quickly than new bone creation. After menopause, you may be at an increased risk for osteoporosis. To help prevent osteoporosis or the bone fractures that can happen because of osteoporosis, the following is recommended:  If you are 46-71 years old, get at least 1,000 mg of calcium and at least 600 mg of vitamin D per day.  If you are older than age 55 but younger than age 65, get at least 1,200 mg of calcium and at least 600 mg of vitamin D per day.  If you are older than age 54, get at least 1,200 mg of calcium and at least 800 mg of vitamin D per day.  Smoking and excessive alcohol intake increase the risk of osteoporosis. Eat foods that are rich in calcium and vitamin D, and do weight-bearing exercises several times each week as directed by your health care provider. What should I know about how menopause affects my mental health? Depression may occur at any age, but it is more common as you become older. Common symptoms of depression  include:  Low or sad mood.  Changes in sleep patterns.  Changes in appetite or eating patterns.  Feeling an overall lack of motivation or enjoyment of activities that you previously enjoyed.  Frequent crying spells.  Talk with your health care provider if you think that you are experiencing depression. What should I know about immunizations? It is important that you get and maintain your immunizations. These include:  Tetanus, diphtheria, and pertussis (Tdap) booster vaccine.  Influenza every year before the flu season begins.  Pneumonia vaccine.  Shingles vaccine.  Your health care provider may also recommend other immunizations. This information is not intended to replace advice given to you by your health care provider. Make sure you discuss any questions you have with your health care provider. Document Released: 01/18/2006  Document Revised: 06/15/2016 Document Reviewed: 08/30/2015 Elsevier Interactive Patient Education  2018 Elsevier Inc.  

## 2018-08-21 ENCOUNTER — Encounter: Payer: Self-pay | Admitting: Internal Medicine

## 2018-10-08 ENCOUNTER — Encounter: Payer: Self-pay | Admitting: Internal Medicine

## 2018-11-10 DIAGNOSIS — Z1231 Encounter for screening mammogram for malignant neoplasm of breast: Secondary | ICD-10-CM | POA: Diagnosis not present

## 2018-11-17 DIAGNOSIS — D2262 Melanocytic nevi of left upper limb, including shoulder: Secondary | ICD-10-CM | POA: Diagnosis not present

## 2018-11-17 DIAGNOSIS — D2272 Melanocytic nevi of left lower limb, including hip: Secondary | ICD-10-CM | POA: Diagnosis not present

## 2018-11-17 DIAGNOSIS — L570 Actinic keratosis: Secondary | ICD-10-CM | POA: Diagnosis not present

## 2018-11-17 DIAGNOSIS — D2271 Melanocytic nevi of right lower limb, including hip: Secondary | ICD-10-CM | POA: Diagnosis not present

## 2018-11-17 DIAGNOSIS — D225 Melanocytic nevi of trunk: Secondary | ICD-10-CM | POA: Diagnosis not present

## 2018-12-09 ENCOUNTER — Ambulatory Visit (INDEPENDENT_AMBULATORY_CARE_PROVIDER_SITE_OTHER): Payer: BLUE CROSS/BLUE SHIELD | Admitting: Sports Medicine

## 2018-12-09 ENCOUNTER — Encounter: Payer: Self-pay | Admitting: Sports Medicine

## 2018-12-09 ENCOUNTER — Ambulatory Visit (INDEPENDENT_AMBULATORY_CARE_PROVIDER_SITE_OTHER): Payer: BLUE CROSS/BLUE SHIELD

## 2018-12-09 VITALS — BP 118/86 | HR 78 | Ht 64.0 in | Wt 148.6 lb

## 2018-12-09 DIAGNOSIS — M545 Low back pain, unspecified: Secondary | ICD-10-CM | POA: Insufficient documentation

## 2018-12-09 DIAGNOSIS — M9905 Segmental and somatic dysfunction of pelvic region: Secondary | ICD-10-CM

## 2018-12-09 DIAGNOSIS — M9903 Segmental and somatic dysfunction of lumbar region: Secondary | ICD-10-CM

## 2018-12-09 DIAGNOSIS — M4185 Other forms of scoliosis, thoracolumbar region: Secondary | ICD-10-CM | POA: Diagnosis not present

## 2018-12-09 DIAGNOSIS — M9908 Segmental and somatic dysfunction of rib cage: Secondary | ICD-10-CM | POA: Diagnosis not present

## 2018-12-09 DIAGNOSIS — M9904 Segmental and somatic dysfunction of sacral region: Secondary | ICD-10-CM

## 2018-12-09 DIAGNOSIS — M2170 Unequal limb length (acquired), unspecified site: Secondary | ICD-10-CM | POA: Insufficient documentation

## 2018-12-09 DIAGNOSIS — M24552 Contracture, left hip: Secondary | ICD-10-CM

## 2018-12-09 DIAGNOSIS — M9902 Segmental and somatic dysfunction of thoracic region: Secondary | ICD-10-CM

## 2018-12-09 MED ORDER — CYCLOBENZAPRINE HCL 5 MG PO TABS: 5.0000 mg | ORAL_TABLET | Freq: Every day | ORAL | 1 refills | Status: DC

## 2018-12-09 NOTE — Progress Notes (Signed)
Veverly FellsMichael D. Delorise Shinerigby, DO  [facility] [place] [phone]  Wilmer FloorLisa A Cullom - 52 y.o. female MRN [id number]  Date of birth: 09/02/1966  Visit Date:   PCP: Gordy SaversKwiatkowski, Peter F, MD   Referred by: Gordy SaversKwiatkowski, Peter F, MD   SUBJECTIVE:  Chief Complaint  Patient presents with  . pain in the lower back    Sx x 2 months. Dx w/ Scoliosis.    HPI: Patient presents with persistent low back pain over the past 2 months that has been worse with increased sitting and traveling over the holidays.  She reports tightness and occasional sharp pain that is rated as moderate.  Does not radiate past the gluteal regions and bilateral hips but does go from the mid back to mainly the left greater than right side.  Pain is worse first thing in the morning she has difficult time going from sit to standing.  Pain is improved with heat and ice.  She has been using Aleve and Tylenol PM intermittently at as well as Biofreeze with mild improvements.  REVIEW OF SYSTEMS: He has seen with subsided but is no fevers chills or night sweats.  Otherwise 12 point review of systems performed is negative.  HISTORY:  Prior history reviewed and updated per electronic medical record.  Social History   Occupational History  . Not on file  Tobacco Use  . Smoking status: Never Smoker  . Smokeless tobacco: Never Used  Substance and Sexual Activity  . Alcohol use: Yes    Alcohol/week: 1.0 standard drinks    Types: 1 Glasses of wine per week  . Drug use: No  . Sexual activity: Not on file   Social History   Social History Narrative  . Not on file      DATA OBTAINED & REVIEWED:  Recent Labs    08/06/18 1358  CALCIUM 9.4  AST 17  ALT 16  TSH 2.31   Problem  Midline Low Back Pain  Short Leg Syndrome, Right, Acquired   3/16" HaPad heel Lift added to the right shoe    01/05/2015 Controlled Substance Contract Signed.  OBJECTIVE:  VS:  HT:5\' 4"  (162.6 cm)   WT:148  lb 9.6 oz (67.4 kg)  BMI:25.49    BP:118/86  HR:78bpm  TEMP: ( )  RESP:97 %   PHYSICAL EXAM: Adult female.  No acute distress.  Alert and appropriate.  Bilateral lower extremities overall well aligned.  She has tightness with hip flexor stretching on the left greater than right.  She has good internal and external rotation of the hips and functional playing.  Her lower extremity strength and sensation are intact to manual muscle testing and light touch.  She is able to heel and toe walk without difficulty.  She does have a anatomic short right leg on the right compared to the left by approximately 1 cm.   ASSESSMENT  1. Midline low back pain, unspecified chronicity, unspecified whether sciatica present   2. Somatic dysfunction of thoracic region   3. Somatic dysfunction of lumbar region   4. Somatic dysfunction of rib cage region   5. Somatic dysfunction of pelvis region   6. Somatic dysfunction of sacral region   7. Left hip flexor tightness   8. Short leg syndrome, right, acquired     PLAN:  Pertinent additional documentation may be included in corresponding procedure notes, imaging studies, problem based documentation and patient instructions.  Procedures:  Osteopathic manipulation was performed today  based on physical exam findings.  Please see procedure note for further information including Osteopathic Exam findings Home Therapeutic exercises prescribed per procedure note.  Medications:   Meds ordered this encounter  Medications  . cyclobenzaprine (FLEXERIL) 5 MG tablet    Sig: Take 1 tablet (5 mg total) by mouth at bedtime.    Dispense:  30 tablet    Refill:  1     Discussion/Instructions: Short leg syndrome, right, acquired 3/16" HaPad heel Lift added to the right shoe  Midline low back pain She should do quite well with conservative measures as she did have good improvement with osteopathic manipulation today.  If any lack of improvement could consider further  diagnostic evaluation with MRI of the lumbar spine but no true radicular component at this time.  THERAPEUTIC EXERCISE: Discussed the foundation of treatment for this condition is physical therapy and/or daily (5-6 days/week) therapeutic exercises, focusing on core strengthening, coordination, neuromuscular control/reeducation.  Home Therapeutic exercises prescribed today per procedure note. Links to [employer] videos provided today per Patient Instructions.  These exercises were developed by Myles LippsEric Goodman, DC with a strong emphasis on core neuromuscular reducation and postural realignment through body-weight exercises. Discussed the underlying features of tight hip flexors leading to crouched, fetal like position that results in spinal column compression.  Including lumbar hyperflexion with hypermobility, thoracic flexion with restrictive rotation and cervical lordosis reversal Discussed red flag symptoms that warrant earlier emergent evaluation and patient voices understanding. Activity modifications and the importance of avoiding exacerbating activities (limiting pain to no more than a 4 / 10 during or following activity) recommended and discussed.   If any lack of improvement: consider further diagnostic evaluation with MRI of the lumbar spine  At follow up will plan: to consider repeat osteopathic manipulation Return in about 6 weeks (around 01/20/2019) for consideration of repeat Osteopathic Manipulation.          Andrena MewsMichael D Rigby, DO     Sports Medicine Physician

## 2018-12-09 NOTE — Assessment & Plan Note (Signed)
3/16" HaPad heel Lift added to the right shoe

## 2018-12-09 NOTE — Assessment & Plan Note (Signed)
She should do quite well with conservative measures as she did have good improvement with osteopathic manipulation today.  If any lack of improvement could consider further diagnostic evaluation with MRI of the lumbar spine but no true radicular component at this time.

## 2018-12-09 NOTE — Patient Instructions (Addendum)
Also check out [employer]"Foundation Training" which is a program developed by Dr. Myles LippsEric Goodman.   There are links to a couple of his YouTube Videos below and I would like to see you performing one of his videos 5-6 days per week.  It is best to do these exercises first thing in the morning.  They will give you a good jumpstart here today and start normalizing the way you move.  A good intro video is: "Independence from Pain 7-minute Video" - https://riley.org/https://www.[email or web address]/watch?v=V179hqrkFJ0   A more advanced video is: Scientist, research (medical)"Foundation training original 12 minutes" - OilGuides.com.eehttps://www.[email or web address]/watch?v=4BOTvaRaDjI  Exercises that focus more on the neck are as below: Dr. Derrill KayGoodman with [employer] teaching neck and shoulder details Part 1 - https://youtu.be/[email or web address] Part 2 Dr. Derrill KayGoodman with [facility] quick routine to practice daily - https://youtu.be/Y63sa6ETT6s  Do not try to attempt the entire video when first beginning.  Try breaking of each exercise that he goes into shorter segments.  In other words, if they perform an exercise for 45 seconds, start with 15 seconds and rest and then resume when they begin the new activity.  If you work your way up to being able to do these videos without having to stop, I expect you will see significant improvements in your pain.  If you enjoy his videos and would like to find out more you can look on his website: [email or web address].  He has a workout streaming option as well as a DVD set available for purchase.  Amazon has the best price for his DVDs.     Please perform the exercise program that we have prepared for you and gone over in detail on a daily basis.  In addition to the handout you were provided you can access your program through: [email or web address]   Your unique program code is:  [email or web address]

## 2018-12-09 NOTE — Progress Notes (Signed)
PROCEDURE NOTE : OSTEOPATHIC MANIPULATION The decision today to treat with Osteopathic Manipulative Therapy (OMT) was based on physical exam findings. Verbal consent was obtained following a discussion with the patient regarding the of risks, benefits and potential side effects, including an acute pain flare,post manipulation soreness and need for repeat treatments.     Contraindications to OMT: NONE  Manipulation was performed as below: Regions Treated & Osteopathic Exam Findings  T4 - 8 Neutral, Rotated LEFT, Sidebent RIGHT Rib 6Right  Posterior L4 FRS right (Flexed, Rotated & Sidebent) Left psoas spasm Left anterior innonimate R on R sacral torsion   OMT Techniques Used  HVLA muscle energy myofascial release    The patient tolerated the treatment well and reported Improved symptoms following treatment today. Patient was given medications, exercises, stretches and lifestyle modifications per AVS and verbally.

## 2018-12-09 NOTE — Progress Notes (Signed)
PROCEDURE NOTE: THERAPEUTIC EXERCISES (97110) 15 minutes spent for Therapeutic exercises as below and as referenced in the AVS.  This included exercises focusing on stretching, strengthening, with significant focus on eccentric aspects.   Proper technique shown and discussed handout in great detail with ATC.  All questions were discussed and answered.   Long term goals include an improvement in range of motion, strength, endurance as well as avoiding reinjury. Frequency of visits is one time as determined during today's  office visit. Frequency of exercises to be performed is as per handout.  EXERCISES REVIEWED:  Goodman Exercises  Pelvic recruitment/tilting  Hip flexor stretching 

## 2019-01-21 ENCOUNTER — Ambulatory Visit: Payer: BLUE CROSS/BLUE SHIELD | Admitting: Sports Medicine

## 2019-01-21 ENCOUNTER — Ambulatory Visit (INDEPENDENT_AMBULATORY_CARE_PROVIDER_SITE_OTHER): Payer: BLUE CROSS/BLUE SHIELD | Admitting: Sports Medicine

## 2019-01-21 ENCOUNTER — Encounter: Payer: Self-pay | Admitting: Sports Medicine

## 2019-01-21 VITALS — BP 116/78 | HR 83 | Ht 64.0 in | Wt 148.4 lb

## 2019-01-21 DIAGNOSIS — R29898 Other symptoms and signs involving the musculoskeletal system: Secondary | ICD-10-CM | POA: Diagnosis not present

## 2019-01-21 DIAGNOSIS — M130 Polyarthritis, unspecified: Secondary | ICD-10-CM

## 2019-01-21 DIAGNOSIS — M545 Low back pain, unspecified: Secondary | ICD-10-CM

## 2019-01-21 DIAGNOSIS — M24552 Contracture, left hip: Secondary | ICD-10-CM | POA: Diagnosis not present

## 2019-01-21 DIAGNOSIS — M2170 Unequal limb length (acquired), unspecified site: Secondary | ICD-10-CM | POA: Diagnosis not present

## 2019-01-21 LAB — COMPREHENSIVE METABOLIC PANEL
ALBUMIN: 4.8 g/dL (ref 3.5–5.2)
ALT: 24 U/L (ref 0–35)
AST: 23 U/L (ref 0–37)
Alkaline Phosphatase: 61 U/L (ref 39–117)
BUN: 21 mg/dL (ref 6–23)
CO2: 27 mEq/L (ref 19–32)
Calcium: 9.2 mg/dL (ref 8.4–10.5)
Chloride: 103 mEq/L (ref 96–112)
Creatinine, Ser: 0.89 mg/dL (ref 0.40–1.20)
GFR: 66.34 mL/min (ref >60.00)
Glucose, Bld: 89 mg/dL (ref 70–99)
Potassium: 4.2 mEq/L (ref 3.5–5.1)
Sodium: 139 mEq/L (ref 135–145)
Total Bilirubin: 0.4 mg/dL (ref 0.2–1.2)
Total Protein: 7.2 g/dL (ref 6.0–8.3)

## 2019-01-21 LAB — CBC WITH DIFFERENTIAL/PLATELET
Basophils Absolute: 0 10*3/uL (ref 0.0–0.1)
Basophils Relative: 0.7 % (ref 0.0–3.0)
Eosinophils Absolute: 0.2 10*3/uL (ref 0.0–0.7)
Eosinophils Relative: 3 % (ref 0.0–5.0)
HCT: 40 % (ref 36.0–46.0)
Hemoglobin: 13.4 g/dL (ref 12.0–15.0)
LYMPHS PCT: 26.5 % (ref 12.0–46.0)
Lymphs Abs: 1.4 10*3/uL (ref 0.7–4.0)
MCHC: 33.5 g/dL (ref 30.0–36.0)
MCV: 89.9 fl (ref 78.0–100.0)
Monocytes Absolute: 0.6 10*3/uL (ref 0.1–1.0)
Monocytes Relative: 11.6 % (ref 3.0–12.0)
Neutro Abs: 3.2 10*3/uL (ref 1.4–7.7)
Neutrophils Relative %: 58.2 % (ref 43.0–77.0)
Platelets: 275 10*3/uL (ref 150.0–400.0)
RBC: 4.44 Mil/uL (ref 3.87–5.11)
RDW: 13.4 % (ref 11.5–15.5)
WBC: 5.5 10*3/uL (ref 4.0–10.5)

## 2019-01-21 LAB — VITAMIN D 25 HYDROXY (VIT D DEFICIENCY, FRACTURES): VITD: 65.02 ng/mL (ref 30.00–100.00)

## 2019-01-21 LAB — SEDIMENTATION RATE: Sed Rate: 26 mm/hr (ref 0–30)

## 2019-01-21 LAB — TSH: TSH: 1.81 u[IU]/mL (ref 0.35–4.50)

## 2019-01-21 LAB — C-REACTIVE PROTEIN: CRP: <1 mg/dL (ref 0.5–20.0)

## 2019-01-21 MED ORDER — PREDNISONE 20 MG PO TABS: ORAL_TABLET | ORAL | 0 refills | Status: DC

## 2019-01-21 NOTE — Progress Notes (Signed)
Caroline Rasmussen. Caroline R[facility] at [facility] [phone]  Caroline Rasmussen - 53 y.o. female MRN [id number]  Date of birth: Mar 26, 1966  Visit Date: January 21, 2019  PCP: Gordy Savers, MD   Referred by: Gordy Savers, MD  SUBJECTIVE:  Chief Complaint  Patient presents with  . Follow-up    LBP.  Derrill Kay, HEP of pelvic tilts, knee rocks, cat/camel and hip flexor stretching.  Flexeril 5 mg.    HPI: Patient is here for follow-up of her back pain.  She reports more stiffness than pain at this time especially first thing in the morning when getting up out of bed or after sitting for even 15 to 20 minutes.  She is down on her hands and knees performing therapeutic exercises she has a hard time, this position.  Pain is mainly in her hip girdles.  She has added a 367 inch heel lift to her right shoe and believes this is causing provement in her pain.  She is not taking the Flexeril as it does not seem to be beneficial.  She has been performing home therapeutic exercises.  REVIEW OF SYSTEMS: Symptoms are slightly bothersome at night.  She denies any significant vision changes.  No significant headaches.  No shoulder girdle weakness or pain.  She has gained 15 pounds over the past year and is quite frustrated by the fact that she is unable to lose weight in spite of increasing her physical activity and watching her diet. Otherwise 12 point review of systems performed and is negative    HISTORY:  Prior history reviewed and updated per electronic medical record.  Patient Active Problem List   Diagnosis Date Noted  . Midline low back pain 12/09/2018    12/09/2018 XR thoracolumbar spine IMPRESSION: Mild thoracolumbar spine scoliosis. Lumbar spine degenerative change. Degenerative changes most prominent L5-S1. No acute bony abnormality identified.   . Short leg syndrome, right, acquired 12/09/2018    3/16" HaPad heel Lift added to the  right shoe   . Achilles tendon pain 07/23/2017  . ADD (attention deficit disorder) 12/21/2015  . 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 Pt with multiple miscarriages   . IBS 10/25/2008    Qualifier: Diagnosis of  By: Leone Payor MD, Charlyne Quale     Social History   Occupational History  . Not on file  Tobacco Use  . Smoking status: Never Smoker  . Smokeless tobacco: Never Used  Substance and Sexual Activity  . Alcohol use: Yes    Alcohol/week: 1.0 standard drinks    Types: 1 Glasses of wine per week  . Drug use: No  . Sexual activity: Not on file   Social History   Social History Narrative  . Not on file     OBJECTIVE:  VS:  HT:5\' 4"  (162.6 cm)   WT:148 lb 6.4 oz (67.3 kg)  BMI:25.46    BP:116/78  HR:83bpm  TEMP: ( )  RESP:96 %   PHYSICAL EXAM: Adult female. No acute distress.  Alert and appropriate. Hip girdle pain is minimal palpation of the gluteal muscles.  She has full overhead range of motion of her shoulder negative straight leg raises bilaterally.  She walks normally but does go sit to stand relatively slowly for her overall fitness level.  She does have trembling and shaking with straight leg raise bilaterally.  This is symmetric.  Lateral paraspinal muscle spasms are present.   ASSESSMENT:  1. Midline low back pain, unspecified chronicity, unspecified whether sciatica present   2. Left hip flexor tightness   3. Short leg syndrome, right, acquired   4. Weakness of both hips   5. Polyarthritis      PROCEDURES:  None  PLAN:  Pertinent additional documentation may be included in corresponding procedure notes, imaging studies, problem based documentation and patient instructions.  No problem-specific Assessment & Plan notes found for this encounter.  Symptoms are concerning for a full amatory arthropathy process versus polymyalgia rheumatica.  The significant hip girdle weakness worse first thing in the morning and with  mobilization is concerning.  She has no red flag symptoms and I would like to actually check some blood work today and initiate a prolonged steroid taper.  This may require prolonged daily steroids spots to this.  We will plan to check in with her in 4 weeks and she how she has progressed    Continue previously prescribed home exercise program.     Activity modifications and the importance of avoiding exacerbating activities (limiting pain to no more than a 4 / 10 during or following activity) recommended and discussed.   Discussed red flag symptoms that warrant earlier emergent evaluation and patient voices understanding.     Meds ordered this encounter  Medications  . predniSONE (DELTASONE) 20 MG tablet    Sig: Take 3tabs PO QAM x3days, 2tabs PO QAM x3days, 1tab PO QAM x4days, 0.5tab PO QAM X4days    Dispense:  21 tablet    Refill:  0    Lab Orders  CBC with Differential/Platelet  VITAMIN D 25 Hydroxy (Vit-D Deficiency, Fractures)  Comprehensive metabolic panel  TSH  Sedimentation rate  C-reactive protein  Rheumatoid factor  Cyclic citrul peptide antibody, IgG  ANA  Imaging Orders  No imaging studies ordered today   Referral Orders  No referral(s) requested today      Return in about 4 weeks (around 02/18/2019) for consideration of repeat Osteopathic Manipulation.          Andrena Mews, DO    [facility]

## 2019-01-22 LAB — RHEUMATOID FACTOR: Rheumatoid fact SerPl-aCnc: <14 IU/mL (ref <14)

## 2019-01-22 LAB — ANA: Anti Nuclear Antibody(ANA): NEGATIVE

## 2019-01-22 LAB — CYCLIC CITRUL PEPTIDE ANTIBODY, IGG: Cyclic Citrullin Peptide Ab: <16 UNITS

## 2019-01-28 ENCOUNTER — Encounter: Payer: Self-pay | Admitting: Sports Medicine

## 2019-02-03 DIAGNOSIS — Z01419 Encounter for gynecological examination (general) (routine) without abnormal findings: Secondary | ICD-10-CM | POA: Diagnosis not present

## 2019-02-03 DIAGNOSIS — Z1151 Encounter for screening for human papillomavirus (HPV): Secondary | ICD-10-CM | POA: Diagnosis not present

## 2019-02-03 DIAGNOSIS — Z6825 Body mass index (BMI) 25.0-25.9, adult: Secondary | ICD-10-CM | POA: Diagnosis not present

## 2019-02-06 ENCOUNTER — Encounter: Payer: Self-pay | Admitting: Internal Medicine

## 2019-02-11 ENCOUNTER — Other Ambulatory Visit: Payer: Self-pay

## 2019-02-11 ENCOUNTER — Encounter: Payer: Self-pay | Admitting: Internal Medicine

## 2019-02-11 ENCOUNTER — Ambulatory Visit (AMBULATORY_SURGERY_CENTER): Payer: Self-pay

## 2019-02-11 VITALS — Ht 65.0 in | Wt 149.0 lb

## 2019-02-11 DIAGNOSIS — Z1211 Encounter for screening for malignant neoplasm of colon: Secondary | ICD-10-CM

## 2019-02-11 NOTE — Progress Notes (Signed)
No egg or soy allergy known to patient  No issues with past sedation with any surgeries  or procedures, no intubation problems  No diet pills per patient No home 02 use per patient  No blood thinners per patient  Pt denies issues with constipation  No A fib or A flutter  EMMI video sent to pt's e mail , pt declined    

## 2019-02-12 ENCOUNTER — Encounter: Payer: Self-pay | Admitting: Internal Medicine

## 2019-02-16 ENCOUNTER — Ambulatory Visit (INDEPENDENT_AMBULATORY_CARE_PROVIDER_SITE_OTHER): Payer: BLUE CROSS/BLUE SHIELD | Admitting: Sports Medicine

## 2019-02-16 ENCOUNTER — Encounter: Payer: Self-pay | Admitting: Sports Medicine

## 2019-02-16 VITALS — BP 118/86 | HR 82 | Ht 65.0 in | Wt 149.4 lb

## 2019-02-16 DIAGNOSIS — M2170 Unequal limb length (acquired), unspecified site: Secondary | ICD-10-CM | POA: Diagnosis not present

## 2019-02-16 DIAGNOSIS — M9905 Segmental and somatic dysfunction of pelvic region: Secondary | ICD-10-CM

## 2019-02-16 DIAGNOSIS — M24552 Contracture, left hip: Secondary | ICD-10-CM | POA: Diagnosis not present

## 2019-02-16 DIAGNOSIS — M545 Low back pain, unspecified: Secondary | ICD-10-CM

## 2019-02-16 NOTE — Patient Instructions (Addendum)
We are ordering an MRI for you today.  The imaging office will be calling you to schedule your appointment after we obtain authorization from your insurance company.   Please be sure you have signed up for MyChart so that we can get your results to you.  We will be in touch with you as soon as we can.  Please know, it can take up to 3-4 business days for the radiologist and Dr. Berline Chough to have time to review the results and determine the best appropriate action.  If there is something that appears to be surgical or needs a referral to other specialists we will let you know through MyChart or telephone.  Please schedule an appointment with Dr. Berline Chough for 2-3 days after your MRI to go over your results.  [facility]: [phone]

## 2019-02-16 NOTE — Progress Notes (Signed)
Caroline Rasmussen. Caroline Rasmussen [facility] Medicine [facility] at [facility] [phone]  Caroline Rasmussen - 53 y.o. female MRN [id number]  Date of birth: 1966/08/27  Visit Date: February 19, 2019  PCP: Gordy Savers, MD   Referred by: Gordy Savers, MD  SUBJECTIVE:  Chief Complaint  Patient presents with  . Follow-up    Low back pain and L hip.  Flexeril.  Derrill Kay and HEP consisting of pelvic tilts, knee rocks, cat/camel and hip flexor stretch    HPI: Patient is here for persistent left hip and back pain.  She reports having moderate improvement while on the steroid but incomplete relief.  It seems that the mornings are the most significant for her still.  She continues to have pain and stiffness for several minutes after awakening.  She does have pain that localizes to the lumbar spine with first awakenings.  She has good internal and external rotation of bilateral hips  REVIEW OF SYSTEMS: No significant nighttime awakenings due to this issue. Denies fevers, chills, recent weight gain or weight loss.  No night sweats.  Pt denies any change in bowel or bladder habits, muscle weakness, numbness or falls associated with this pain.  HISTORY:  Prior history reviewed and updated per electronic medical record.  Patient Active Problem List   Diagnosis Date Noted  . Midline low back pain 12/09/2018    12/09/2018 XR thoracolumbar spine IMPRESSION: Mild thoracolumbar spine scoliosis. Lumbar spine degenerative change. Degenerative changes most prominent L5-S1. No acute bony abnormality identified.   . Short leg syndrome, right, acquired 12/09/2018    3/16" HaPad heel Lift added to the right shoe   . Achilles tendon pain 07/23/2017  . ADD (attention deficit disorder) 12/21/2015  . 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 Pt with multiple miscarriages   . IBS 10/25/2008    Qualifier: Diagnosis of  By: Leone Payor MD, Charlyne Quale     Social  History   Occupational History  . Not on file  Tobacco Use  . Smoking status: Never Smoker  . Smokeless tobacco: Never Used  Substance and Sexual Activity  . Alcohol use: Yes    Alcohol/week: 1.0 standard drinks    Types: 1 Glasses of wine per week  . Drug use: No  . Sexual activity: Not on file   Social History   Social History Narrative  . Not on file     OBJECTIVE:  VS:  HT:5\' 5"  (165.1 cm)   WT:149 lb 6.4 oz (67.8 kg)  BMI:24.86    BP:118/86  HR:82bpm  TEMP: ( )  RESP:98 %   PHYSICAL EXAM: Adult female. No acute distress.  Alert and appropriate. Bilateral hips are normal in alignment with good internal and external rotation.  She has a negative logroll, Stinchfield and Faber/FADIR.  She has pain over the left SI joint focally.  Most focal tenderness however is over the anterior joint capsule however the hip flexor musculature.   ASSESSMENT:   1. Midline low back pain, unspecified chronicity, unspecified whether sciatica present   2. Left hip flexor tightness   3. Short leg syndrome, right, acquired   4. Somatic dysfunction of pelvis region     PROCEDURES:  PROCEDURE NOTE: OSTEOPATHIC MANIPULATION  The decision today to treat with Osteopathic Manipulative Therapy (OMT) was based on physical exam findings. Verbal consent was obtained following a discussion with the patient regarding the of risks, benefits and potential side effects, including  an acute pain flare,post manipulation soreness and need for repeat treatments.   Contraindications to OMT: NONE Manipulation was performed as below: Regions Treated & Osteopathic Exam Findings PELVIS:  Left psoas spasm Left anterior innonimate  OMT Techniques Used: HVLA muscle energy myofascial release soft tissue counterstrain  The patient tolerated the treatment well and reported Improved symptoms following treatment today. Patient was given medications, exercises, stretches and lifestyle modifications per AVS and  verbally.      PLAN:  Pertinent additional documentation may be included in corresponding procedure notes, imaging studies, problem based documentation and patient instructions.  No problem-specific Assessment & Plan notes found for this encounter.   She is significantly tight hip flexors especially on the left compared to the right.  Functionally things are mobile but there is poor recruitment of her hip abductor's and hip flexors.  She did respond well to osteopathic manipulation today however I am concerned that she may have significant lumbar facet arthropathy that is likely contributing to an inability to fully release this.  Further diagnostic investigation with MRI of the lumbar spine for consideration of RFA/facet blocks as indicated.    Continue previously prescribed home exercise program.   Osteopathic manipulation was performed today based on physical exam findings.  Patient has responded well to osteopathic manipulation previously the prior manipulation did not provide permanent long lasting relief.  The patient does feel as though there was significant benefit to the prior manipulation and they wished for repeat manipulation today.  They understand that home therapeutic exercises are critical part of the healing/treatment process and will continue with self treatment between now and their next visit as outlined.  The patient understands that the frequency of visits is meant to provide a stimulus to promote the body's own ability to heal and is not meant to be the sole means for improvement in their symptoms.  Activity modifications and the importance of avoiding exacerbating activities (limiting pain to no more than a 4 / 10 during or following activity) recommended and discussed.  Discussed red flag symptoms that warrant earlier emergent evaluation and patient voices understanding.   No orders of the defined types were placed in this encounter.  Lab Orders  No laboratory test(s)  ordered today    Imaging Orders     MR Lumbar Spine Wo Contrast Referral Orders  No referral(s) requested today    Return for MRI results review in office.          Caroline Mews, DO    [facility]

## 2019-02-25 ENCOUNTER — Encounter: Payer: BLUE CROSS/BLUE SHIELD | Admitting: Internal Medicine

## 2019-03-25 ENCOUNTER — Encounter: Payer: BLUE CROSS/BLUE SHIELD | Admitting: Internal Medicine

## 2019-05-05 ENCOUNTER — Telehealth: Payer: Self-pay | Admitting: *Deleted

## 2019-05-05 NOTE — Telephone Encounter (Signed)
Covid-19 travel screening questions  Have you traveled in the last 14 days? Yes- [place] If yes where? 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 no  Do you have any respiratory symptoms of shortness of breath or cough now or in the last 14 days? no  Do you have any family members or close contacts with diagnosed or suspected Covid-19? No  Pt is aware that care partner will be waiting in the car during procedure. She will wear a mask into the building

## 2019-05-06 ENCOUNTER — Ambulatory Visit (AMBULATORY_SURGERY_CENTER): Payer: BLUE CROSS/BLUE SHIELD | Admitting: Internal Medicine

## 2019-05-06 ENCOUNTER — Other Ambulatory Visit: Payer: Self-pay

## 2019-05-06 ENCOUNTER — Encounter: Payer: Self-pay | Admitting: Internal Medicine

## 2019-05-06 ENCOUNTER — Encounter: Payer: BLUE CROSS/BLUE SHIELD | Admitting: Internal Medicine

## 2019-05-06 VITALS — BP 120/73 | HR 60 | Temp 98.7°F | Resp 9 | Ht 65.0 in | Wt 149.0 lb

## 2019-05-06 DIAGNOSIS — Z1211 Encounter for screening for malignant neoplasm of colon: Secondary | ICD-10-CM | POA: Diagnosis not present

## 2019-05-06 MED ORDER — SODIUM CHLORIDE 0.9 % IV SOLN: 500.0000 mL | Freq: Once | INTRAVENOUS | Status: DC

## 2019-05-06 NOTE — Progress Notes (Signed)
Report to PACU, RN, vss, BBS= Clear.  

## 2019-05-06 NOTE — Patient Instructions (Addendum)
No polyps or cancer seen.  You do have diverticulosis - thickened muscle rings and pouches in the colon wall. Please read the handout about this condition.   Next routine colonoscopy or other screening test in 10 years - 2030  I appreciate the opportunity to care for you. Iva Boop, MD, [facility]   Discharge instructions given. Handout on diverticulosis. Resume previous medications. YOU HAD AN ENDOSCOPIC PROCEDURE TODAY AT THE [facility]:   Refer to the procedure report that was given to you for any specific questions about what was found during the examination.  If the procedure report does not answer your questions, please call your gastroenterologist to clarify.  If you requested that your care partner not be given the details of your procedure findings, then the procedure report has been included in a sealed envelope for you to review at your convenience later.  YOU SHOULD EXPECT: Some feelings of bloating in the abdomen. Passage of more gas than usual.  Walking can help get rid of the air that was put into your GI tract during the procedure and reduce the bloating. If you had a lower endoscopy (such as a colonoscopy or flexible sigmoidoscopy) you may notice spotting of blood in your stool or on the toilet paper. If you underwent a bowel prep for your procedure, you may not have a normal bowel movement for a few days.  Please Note:  You might notice some irritation and congestion in your nose or some drainage.  This is from the oxygen used during your procedure.  There is no need for concern and it should clear up in a day or so.  SYMPTOMS TO REPORT IMMEDIATELY:   Following lower endoscopy (colonoscopy or flexible sigmoidoscopy):  Excessive amounts of blood in the stool  Significant tenderness or worsening of abdominal pains  Swelling of the abdomen that is new, acute  Fever of 100F or higher  For urgent or emergent issues, a gastroenterologist can be reached at any  hour by calling [phone].   DIET:  We do recommend a small meal at first, but then you may proceed to your regular diet.  Drink plenty of fluids but you should avoid alcoholic beverages for 24 hours.  ACTIVITY:  You should plan to take it easy for the rest of today and you should NOT DRIVE or use heavy machinery until tomorrow (because of the sedation medicines used during the test).    FOLLOW UP: Our staff will call the number listed on your records 48-72 hours following your procedure to check on you and address any questions or concerns that you may have regarding the information given to you following your procedure. If we do not reach you, we will leave a message.  We will attempt to reach you two times.  During this call, we will ask if you have developed any symptoms of COVID 19. If you develop any symptoms (ie: fever, flu-like symptoms, shortness of breath, cough etc.) before then, please call [phone].  If you test positive for Covid 19 in the 2 weeks post procedure, please call and report this information to [place].    If any biopsies were taken you will be contacted by phone or by letter within the next 1-3 weeks.  Please call us at [phone] if you have not heard about the biopsies in 3 weeks.    SIGNATURES/CONFIDENTIALITY: You and/or your care partner have signed paperwork which will be entered into your electronic medical record.  These signatures attest to the fact that that the information above on your After Visit Summary has been reviewed and is understood.  Full responsibility of the confidentiality of this discharge information lies with you and/or your care-partner.

## 2019-05-06 NOTE — Progress Notes (Signed)
Pt's states no medical or surgical changes since previsit or office visit. Covid and temp done by CW. Vital signs done by JB.

## 2019-05-06 NOTE — Op Note (Signed)
[facility] Patient Name: Caroline Rasmussen Procedure Date: 05/06/2019 10:56 AM MRN: [id number] Endoscopist: Iva Boop , MD Age: 54 Referring MD:  Date of Birth: Oct 01, 1966 Gender: Female Account #: [id number] Procedure:                Colonoscopy Indications:              Screening for colorectal malignant neoplasm Medicines:                Propofol per Anesthesia, Monitored Anesthesia Care Procedure:                Pre-Anesthesia Assessment:                           - Prior to the procedure, a History and Physical                            was performed, and patient medications and                            allergies were reviewed. The patient's tolerance of                            previous anesthesia was also reviewed. The risks                            and benefits of the procedure and the sedation                            options and risks were discussed with the patient.                            All questions were answered, and informed consent                            was obtained. Prior Anticoagulants: The patient has                            taken no previous anticoagulant or antiplatelet                            agents. ASA Grade Assessment: I - A normal, healthy                            patient. After reviewing the risks and benefits,                            the patient was deemed in satisfactory condition to                            undergo the procedure.                           After obtaining informed consent, the colonoscope  was passed under direct vision. Throughout the                            procedure, the patient's blood pressure, pulse, and                            oxygen saturations were monitored continuously. The                            Colonoscope was introduced through the anus and                            advanced to the the cecum, identified by   appendiceal orifice and ileocecal valve. The                            ileocecal valve, appendiceal orifice, and rectum                            were photographed. The quality of the bowel                            preparation was good. The bowel preparation used                            was Miralax via split dose instruction. Scope In: 11:01:13 AM Scope Out: 11:16:46 AM Scope Withdrawal Time: 0 hours 11 minutes 19 seconds  Total Procedure Duration: 0 hours 15 minutes 33 seconds  Findings:                 The perianal and digital rectal examinations were                            normal.                           Multiple diverticula were found in the sigmoid                            colon.                           The exam was otherwise without abnormality on                            direct and retroflexion views. Complications:            No immediate complications. Estimated blood loss:                            None. Estimated Blood Loss:     Estimated blood loss: none. Impression:               - Moderate diverticulosis in the sigmoid colon.                           - The examination was otherwise normal  on direct                            and retroflexion views.                           - No specimens collected. Recommendation:           - Repeat colonoscopy/other appropriate test in 10                            years for screening purposes.                           - Resume previous diet.                           - Continue present medications. Iva Booparl E Sheryl Saintil, MD 05/06/2019 11:22:46 AM This report has been signed electronically.

## 2019-05-08 ENCOUNTER — Telehealth: Payer: Self-pay | Admitting: *Deleted

## 2019-05-08 NOTE — Telephone Encounter (Signed)
  Follow up Call-  Call back number 05/06/2019  Post procedure Call Back phone  # [phone]  Permission to leave phone message Yes  Some recent data might be hidden    [facility]

## 2019-05-08 NOTE — Telephone Encounter (Signed)
  Follow up Call-  Call back number 05/06/2019  Post procedure Call Back phone  # [phone]  Permission to leave phone message Yes  Some recent data might be hidden     Patient questions:  Do you have a fever, pain , or abdominal swelling? No. Pain Score  0 *  Have you tolerated food without any problems? Yes.    Have you been able to return to your normal activities? Yes.    Do you have any questions about your discharge instructions: Diet   no Medications  No. Follow up visit  No.  Do you have questions or concerns about your Care? No.  Actions: * If pain score is 4 or above: No action needed, pain <4.  1. Have you developed a fever since your procedure? no  2.   Have you had an respiratory symptoms (SOB or cough) since your procedure? no  3.   Have you tested positive for COVID 19 since your procedure no  4.   Have you had any family members/close contacts diagnosed with the COVID 19 since your procedure?  no   If yes to any of these questions please route to Laverna Peace, RN and Jennye Boroughs, Charity fundraiser.

## 2019-06-08 ENCOUNTER — Other Ambulatory Visit: Payer: Self-pay | Admitting: Sports Medicine

## 2019-06-16 ENCOUNTER — Other Ambulatory Visit: Payer: Self-pay

## 2019-06-16 ENCOUNTER — Ambulatory Visit
Admission: RE | Admit: 2019-06-16 | Discharge: 2019-06-16 | Disposition: A | Discharge status: Home / Self Care | Payer: BC Managed Care – PPO | Source: Ambulatory Visit | Attending: Sports Medicine | Admitting: Sports Medicine

## 2019-06-16 DIAGNOSIS — M545 Low back pain, unspecified: Secondary | ICD-10-CM

## 2019-06-16 DIAGNOSIS — M5137 Other intervertebral disc degeneration, lumbosacral region: Secondary | ICD-10-CM | POA: Diagnosis not present

## 2019-06-22 ENCOUNTER — Encounter: Payer: Self-pay | Admitting: Family Medicine

## 2019-06-23 ENCOUNTER — Other Ambulatory Visit: Payer: Self-pay

## 2019-06-23 DIAGNOSIS — M545 Low back pain, unspecified: Secondary | ICD-10-CM

## 2019-07-02 ENCOUNTER — Ambulatory Visit: Payer: BC Managed Care – PPO | Admitting: Family Medicine

## 2019-07-06 DIAGNOSIS — M545 Low back pain: Secondary | ICD-10-CM | POA: Diagnosis not present

## 2019-07-10 DIAGNOSIS — M545 Low back pain: Secondary | ICD-10-CM | POA: Diagnosis not present

## 2019-07-13 DIAGNOSIS — M545 Low back pain: Secondary | ICD-10-CM | POA: Diagnosis not present

## 2019-07-16 DIAGNOSIS — M545 Low back pain: Secondary | ICD-10-CM | POA: Diagnosis not present

## 2019-07-20 DIAGNOSIS — M545 Low back pain: Secondary | ICD-10-CM | POA: Diagnosis not present

## 2019-07-23 DIAGNOSIS — M545 Low back pain: Secondary | ICD-10-CM | POA: Diagnosis not present

## 2019-07-27 DIAGNOSIS — M545 Low back pain: Secondary | ICD-10-CM | POA: Diagnosis not present

## 2019-07-30 DIAGNOSIS — M545 Low back pain: Secondary | ICD-10-CM | POA: Diagnosis not present

## 2019-08-03 DIAGNOSIS — M545 Low back pain: Secondary | ICD-10-CM | POA: Diagnosis not present

## 2019-08-04 IMAGING — DX DG THORACOLUMBAR SPINE 2V
4 series · 4 of 4 positions shown · non-contrast
Comparison: Chest x-ray 09/14/2010.

CLINICAL DATA: Back pain.

EXAM:
THORACOLUMBAR SPINE 1V

[thoracic spine ap]
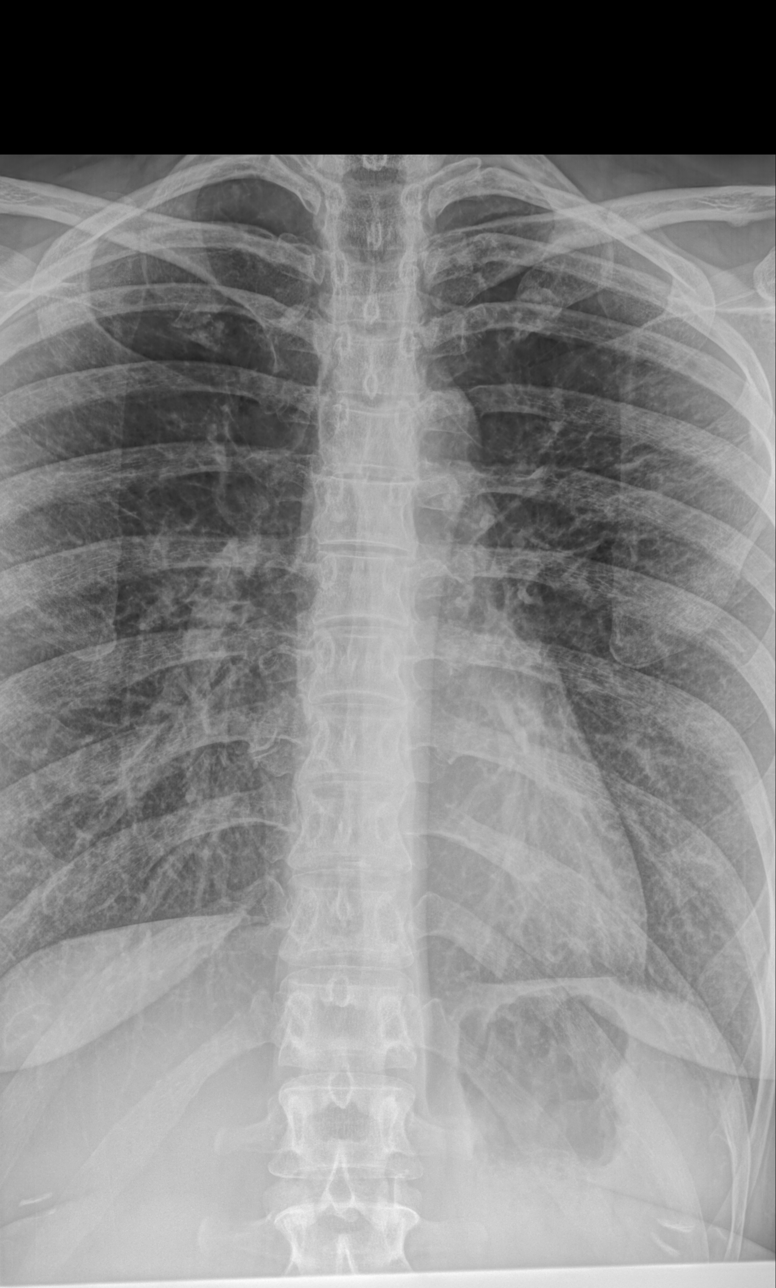

[lumbar spine ap]
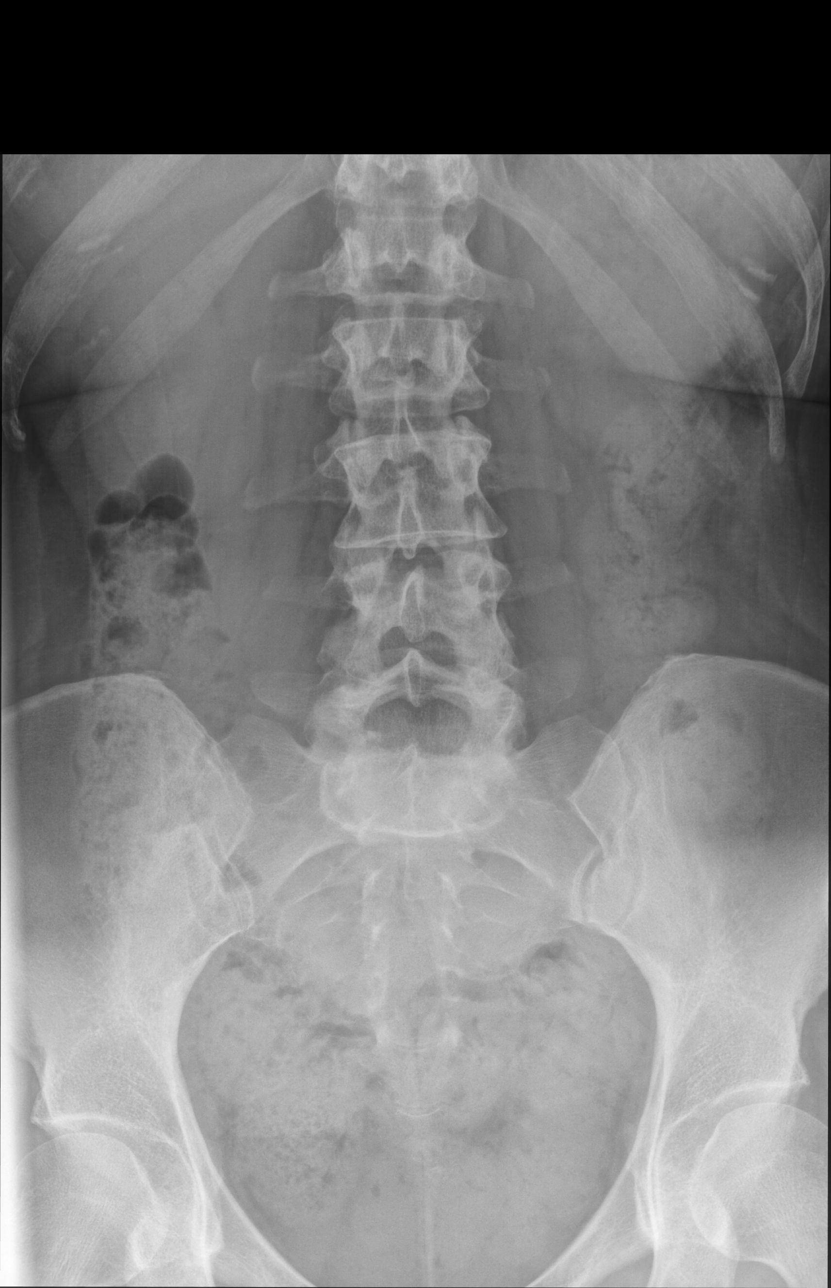

[thoracic spine lat]
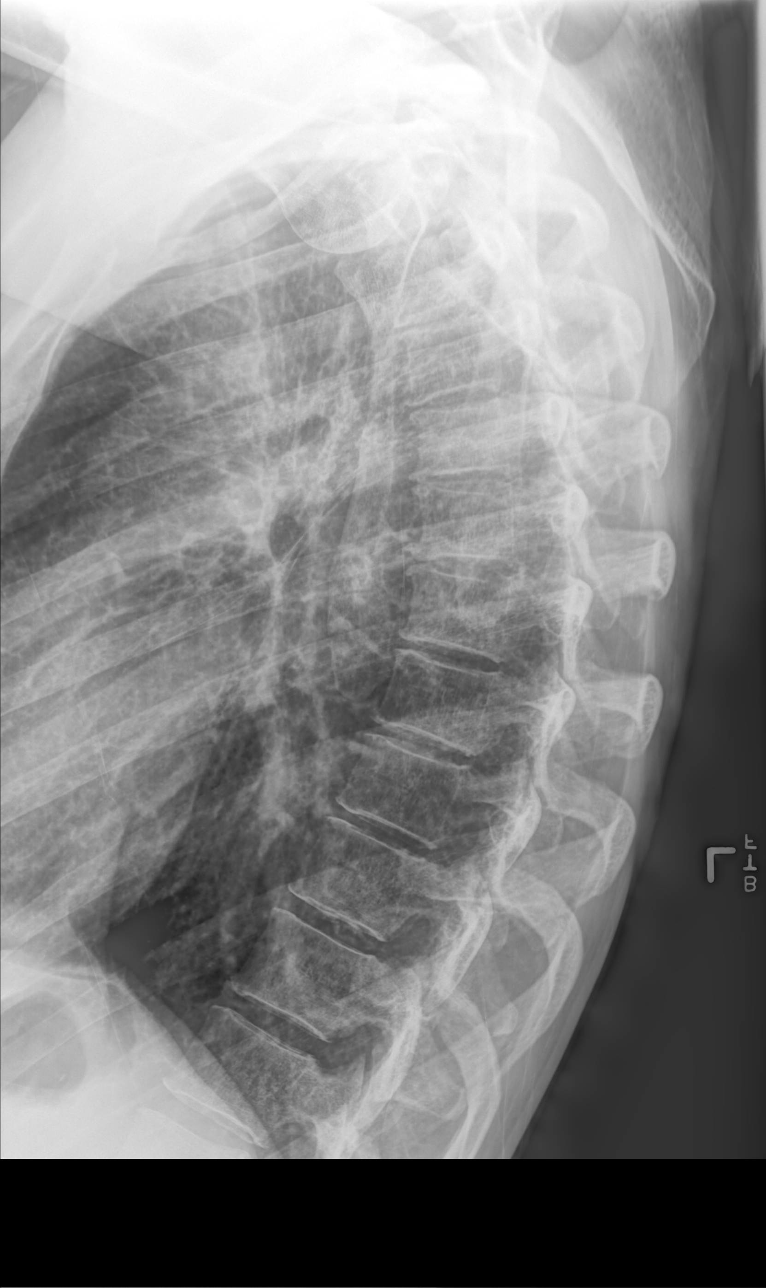

[lumbar spine lat]
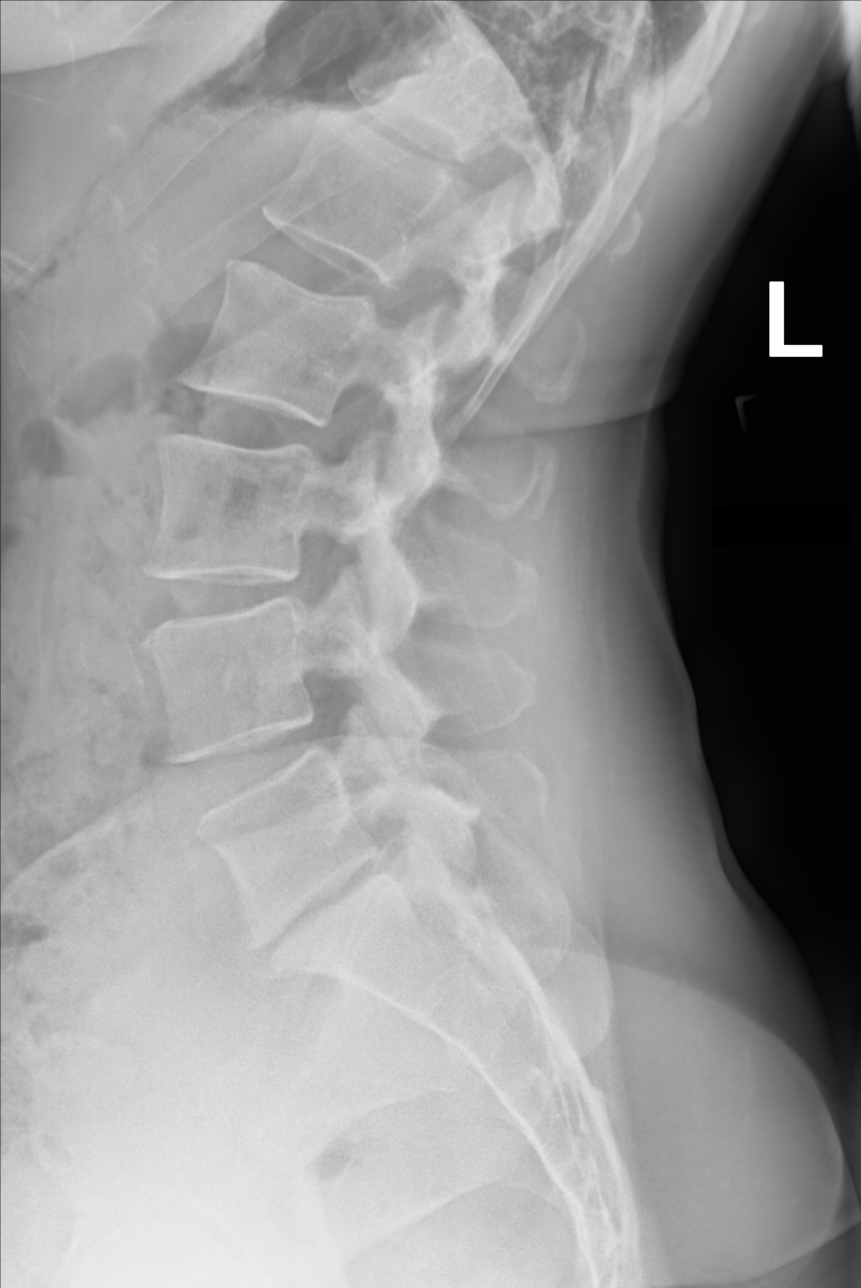

[4 of 4 positions shown; findings below may reference images not displayed]

FINDINGS: Lumbar spine numbered with the lowest segmented appearing lumbar
shaped vertebrae on lateral view as L5. Mild thoracolumbar spine
scoliosis. Diffuse mild degenerative change. Degenerative changes
most prominent L5-S1 where there is prominent disc degeneration. No
acute bony abnormality.
IMPRESSION: Mild thoracolumbar spine scoliosis. Lumbar spine degenerative
change. Degenerative changes most prominent L5-S1. No acute bony
abnormality identified.

## 2019-08-06 DIAGNOSIS — M545 Low back pain: Secondary | ICD-10-CM | POA: Diagnosis not present

## 2019-08-10 DIAGNOSIS — M545 Low back pain: Secondary | ICD-10-CM | POA: Diagnosis not present

## 2019-08-18 DIAGNOSIS — M545 Low back pain: Secondary | ICD-10-CM | POA: Diagnosis not present

## 2019-09-09 DIAGNOSIS — M545 Low back pain: Secondary | ICD-10-CM | POA: Diagnosis not present

## 2019-11-12 DIAGNOSIS — Z1231 Encounter for screening mammogram for malignant neoplasm of breast: Secondary | ICD-10-CM | POA: Diagnosis not present

## 2020-02-08 DIAGNOSIS — L918 Other hypertrophic disorders of the skin: Secondary | ICD-10-CM | POA: Diagnosis not present

## 2020-02-08 DIAGNOSIS — D2272 Melanocytic nevi of left lower limb, including hip: Secondary | ICD-10-CM | POA: Diagnosis not present

## 2020-02-08 DIAGNOSIS — L821 Other seborrheic keratosis: Secondary | ICD-10-CM | POA: Diagnosis not present

## 2020-02-08 DIAGNOSIS — L570 Actinic keratosis: Secondary | ICD-10-CM | POA: Diagnosis not present

## 2020-02-08 DIAGNOSIS — D225 Melanocytic nevi of trunk: Secondary | ICD-10-CM | POA: Diagnosis not present

## 2020-04-06 DIAGNOSIS — Z6822 Body mass index (BMI) 22.0-22.9, adult: Secondary | ICD-10-CM | POA: Diagnosis not present

## 2020-04-06 DIAGNOSIS — Z01419 Encounter for gynecological examination (general) (routine) without abnormal findings: Secondary | ICD-10-CM | POA: Diagnosis not present

## 2020-06-14 DIAGNOSIS — I8391 Asymptomatic varicose veins of right lower extremity: Secondary | ICD-10-CM | POA: Diagnosis not present

## 2020-06-14 DIAGNOSIS — L821 Other seborrheic keratosis: Secondary | ICD-10-CM | POA: Diagnosis not present

## 2020-06-14 DIAGNOSIS — L570 Actinic keratosis: Secondary | ICD-10-CM | POA: Diagnosis not present

## 2020-11-14 DIAGNOSIS — Z1231 Encounter for screening mammogram for malignant neoplasm of breast: Secondary | ICD-10-CM | POA: Diagnosis not present

## 2020-11-29 DIAGNOSIS — Z03818 Encounter for observation for suspected exposure to other biological agents ruled out: Secondary | ICD-10-CM | POA: Diagnosis not present

## 2020-11-29 DIAGNOSIS — Z20822 Contact with and (suspected) exposure to covid-19: Secondary | ICD-10-CM | POA: Diagnosis not present

## 2020-12-13 DIAGNOSIS — Z20822 Contact with and (suspected) exposure to covid-19: Secondary | ICD-10-CM | POA: Diagnosis not present

## 2020-12-13 DIAGNOSIS — Z03818 Encounter for observation for suspected exposure to other biological agents ruled out: Secondary | ICD-10-CM | POA: Diagnosis not present

## 2021-01-25 ENCOUNTER — Ambulatory Visit: Payer: Self-pay

## 2021-01-25 ENCOUNTER — Ambulatory Visit (INDEPENDENT_AMBULATORY_CARE_PROVIDER_SITE_OTHER): Payer: BC Managed Care – PPO | Admitting: Family Medicine

## 2021-01-25 ENCOUNTER — Ambulatory Visit (INDEPENDENT_AMBULATORY_CARE_PROVIDER_SITE_OTHER): Payer: BC Managed Care – PPO

## 2021-01-25 ENCOUNTER — Other Ambulatory Visit: Payer: Self-pay

## 2021-01-25 ENCOUNTER — Encounter: Payer: Self-pay | Admitting: Family Medicine

## 2021-01-25 VITALS — BP 128/80 | HR 92 | Ht 65.0 in | Wt 139.8 lb

## 2021-01-25 DIAGNOSIS — M79672 Pain in left foot: Secondary | ICD-10-CM

## 2021-01-25 DIAGNOSIS — M7732 Calcaneal spur, left foot: Secondary | ICD-10-CM | POA: Diagnosis not present

## 2021-01-25 NOTE — Progress Notes (Signed)
    Subjective:    CC: L foot pain  I, Molly Weber, LAT, ATC, am serving as scribe for Dr. Clementeen Graham.  HPI: Pt is a 55 y/o female presenting w/ c/o L foot pain since yesterday when she tripped going down her garage stairs after tripping over her husband's boot and rolled her foot into inversion .  She locates her pain to her L lateral foot.  She was seen in Sept 2018 for B Achille's pain.  She has a hx of a L 5th MT fracture.  Radiating pain: no L foot swelling: yes and bruising Aggravating factors: weight bearing/walking;  Treatments tried: ice  Pertinent review of Systems: No fevers or chills  Relevant historical information: Factor V Leiden deficiency.  History of fifth metatarsal fracture.   Objective:    Vitals:   01/25/21 1004  BP: 128/80  Pulse: 92  SpO2: 97%   General: Well Developed, well nourished, and in no acute distress.   MSK: Left foot swelling and bruising lateral midfoot. Normal foot ankle motion. Tender palpation lateral midfoot. Pulses capillary refill and sensation are intact distally.  Lab and Radiology Results  X-ray images left foot obtained today personally and independently interpreted No acute fractures visible for my interpretation. Await formal radiology review   Impression and Recommendations:    Assessment and Plan: 55 y.o. female with left midfoot pain and swelling and bruising following inversion injury.  Clinically concerning for fracture.  I am suspicious that she has a radiographically occult fracture at the cuboid.  However I do not see obvious fracture on x-ray.  Radiology overread is still pending.  Plan for immobilization with cam walker boot and recheck in 1 week.  If significantly improved will consider this a sprain and move along with conservative management.  If not significant better repeat x-ray very likely.Marland Kitchen  PDMP not reviewed this encounter. Orders Placed This Encounter  Procedures  . DG Foot Complete Left     Standing Status:   Future    Number of Occurrences:   1    Standing Expiration Date:   02/22/2021    Order Specific Question:   Reason for Exam (SYMPTOM  OR DIAGNOSIS REQUIRED)    Answer:   L foot pain    Order Specific Question:   Is patient pregnant?    Answer:   No    Order Specific Question:   Preferred imaging location?    Answer:   [facility]   No orders of the defined types were placed in this encounter.   Discussed warning signs or symptoms. Please see discharge instructions. Patient expresses understanding.   The above documentation has been reviewed and is accurate and complete Clementeen Graham, M.D.

## 2021-01-25 NOTE — Patient Instructions (Addendum)
Thank you for coming in today.  I am concerned you may have broken the cuboid bone in the foot but I am not certain.  Radiological will generate a report today or tomorrow.  You will get the results the same time I do from radiology and than I will send you some commentary on the results.   Recheck with me in about 1 week unless 100% better.   Use the cam walker boot for now.   Continue Vit D and calcium.   Tylenol is the safest OTC medicine for pain.

## 2021-01-27 NOTE — Progress Notes (Signed)
Left foot x-ray shows no acute fractures.

## 2021-02-01 ENCOUNTER — Ambulatory Visit: Payer: BC Managed Care – PPO | Admitting: Family Medicine

## 2021-02-01 NOTE — Progress Notes (Signed)
   I, Christoper Fabian, LAT, ATC, am serving as scribe for Dr. Clementeen Graham.  Caroline Rasmussen is a 55 y.o. female who presents to [facility] today for f/u of L lateral foot pain after rolling her foot into inversion when she tripped over her husband's boot when going down her garage stairs on 01/24/21.  She was last seen by Dr. Denyse Amass on 01/25/21 and was advised to use a Cam walker boot and to con't Vit D and Calcium.  Since then, pt reports her L lateral foot pain remains about the same.  She states that she has begun to have some pain along her dorsal midfoot in the area of the cuneiforms/cuboid that radiates into the L lateral foot.  Her bruising is located just distal to her L lateral malleolus and into her distal foot near the MTP joints.  She con't to wear the boot but notes that she con't to have quite a bit of pain outside of the boot.   Patient has a hiking trip in [place] in the beginning of May.  Diagnostic imaging: L foot XR- 01/25/21  Pertinent review of systems: No fevers or chills  Relevant historical information: 5 deficiency   Exam:  BP (!) 128/98 (BP Location: Right Arm, Patient Position: Sitting, Cuff Size: Normal)   Pulse 96   Ht 5\' 5"  (1.651 m)   Wt 140 lb 12.8 oz (63.9 kg)   LMP 11/16/2014   SpO2 98%   BMI 23.43 kg/m  General: Well Developed, well nourished, and in no acute distress.   MSK: Left foot slight bruising dorsal lateral forefoot.  Mildly tender palpation dorsal midfoot. Normal foot and ankle motion.  Stable ligamentous exam. Pulses cap refill and sensation are intact distally.    Lab and Radiology Results  X-ray images left foot obtained today personally and independently interpreted No acute fractures visible. Await formal radiology review    Assessment and Plan: 55 y.o. female with left midfoot pain thought to be due to a sprain of midfoot.  Patient still does have the possibility of a radiographically occult  midfoot fracture specifically of the cuboid bone.  Clinically however she is improving making fracture less likely.  Plan to transition to either a postop shoe or a normal shoe with toe insole.  If worsening or not improving in 2 weeks we will proceed to MRI very likely.  Consider physical therapy as well.  Patient will submit update in 2 weeks how she is feeling.   PDMP not reviewed this encounter. Orders Placed This Encounter  Procedures  . DG Foot Complete Left    Standing Status:   Future    Number of Occurrences:   1    Standing Expiration Date:   02/02/2022    Order Specific Question:   Reason for Exam (SYMPTOM  OR DIAGNOSIS REQUIRED)    Answer:   eval midfot pain especially cuboid    Order Specific Question:   Is patient pregnant?    Answer:   No    Order Specific Question:   Preferred imaging location?    Answer:   02/04/2022   No orders of the defined types were placed in this encounter.    Discussed warning signs or symptoms. Please see discharge instructions. Patient expresses understanding.   The above documentation has been reviewed and is accurate and complete Kyra Searles, M.D.

## 2021-02-02 ENCOUNTER — Encounter: Payer: Self-pay | Admitting: Family Medicine

## 2021-02-02 ENCOUNTER — Other Ambulatory Visit: Payer: Self-pay

## 2021-02-02 ENCOUNTER — Ambulatory Visit (INDEPENDENT_AMBULATORY_CARE_PROVIDER_SITE_OTHER): Payer: BC Managed Care – PPO | Admitting: Family Medicine

## 2021-02-02 ENCOUNTER — Ambulatory Visit (INDEPENDENT_AMBULATORY_CARE_PROVIDER_SITE_OTHER): Payer: BC Managed Care – PPO

## 2021-02-02 VITALS — BP 128/98 | HR 96 | Ht 65.0 in | Wt 140.8 lb

## 2021-02-02 DIAGNOSIS — M79672 Pain in left foot: Secondary | ICD-10-CM

## 2021-02-02 NOTE — Patient Instructions (Signed)
Thank you for coming in today.  Ok to transition to post op shoe or even full turf toe insole.   Get a Steel Turf Toe insole.  Do a Microbiologist for [employer]  If not improving let me know. Next step is MRI.  Could also consider physical therapy.  Keep me updated.  Send a message in 2 weeks.

## 2021-02-06 ENCOUNTER — Encounter: Payer: Self-pay | Admitting: Family Medicine

## 2021-02-06 ENCOUNTER — Telehealth: Payer: Self-pay | Admitting: Family Medicine

## 2021-02-06 DIAGNOSIS — S92902A Unspecified fracture of left foot, initial encounter for closed fracture: Secondary | ICD-10-CM

## 2021-02-06 NOTE — Progress Notes (Signed)
X-ray left foot is concerning for a fracture at the cuboid to radiology.  They do recommend MRI now.  I will order MRI

## 2021-02-06 NOTE — Telephone Encounter (Signed)
MRI foot ordered to evaluate for likely cuboid fracture.

## 2021-02-11 ENCOUNTER — Other Ambulatory Visit: Payer: Self-pay

## 2021-02-11 ENCOUNTER — Ambulatory Visit (INDEPENDENT_AMBULATORY_CARE_PROVIDER_SITE_OTHER): Payer: BC Managed Care – PPO

## 2021-02-11 DIAGNOSIS — S92902A Unspecified fracture of left foot, initial encounter for closed fracture: Secondary | ICD-10-CM

## 2021-02-11 DIAGNOSIS — S9032XA Contusion of left foot, initial encounter: Secondary | ICD-10-CM | POA: Diagnosis not present

## 2021-02-11 DIAGNOSIS — S92212A Displaced fracture of cuboid bone of left foot, initial encounter for closed fracture: Secondary | ICD-10-CM | POA: Diagnosis not present

## 2021-02-14 ENCOUNTER — Encounter: Payer: Self-pay | Admitting: Family Medicine

## 2021-02-15 DIAGNOSIS — I788 Other diseases of capillaries: Secondary | ICD-10-CM | POA: Diagnosis not present

## 2021-02-15 DIAGNOSIS — D2239 Melanocytic nevi of other parts of face: Secondary | ICD-10-CM | POA: Diagnosis not present

## 2021-02-15 DIAGNOSIS — D2262 Melanocytic nevi of left upper limb, including shoulder: Secondary | ICD-10-CM | POA: Diagnosis not present

## 2021-02-15 DIAGNOSIS — L821 Other seborrheic keratosis: Secondary | ICD-10-CM | POA: Diagnosis not present

## 2021-02-16 ENCOUNTER — Encounter: Payer: Self-pay | Admitting: Family Medicine

## 2021-02-16 NOTE — Progress Notes (Signed)
MRI left foot does show a small fracture at the cuboid bone of the foot.  Plan for limited weightbearing with crutches.  Return to clinic with me to go over these results next week.  If you are comfortable wearing just the postop shoe with limited weightbearing the do that.  If not return to the cam walker boot.

## 2021-02-17 NOTE — Progress Notes (Signed)
I, Christoper Fabian, LAT, ATC, am serving as scribe for Dr. Clementeen Graham.  Caroline Rasmussen is a 55 y.o. female who presents to [facility] at [facility] today for f/u of L midfoot pain after rolling her foot into inversion when she tripped over her husband's boot when going down her garage stairs on 01/24/21. Pt was last seen by Dr. Denyse Amass on 02/02/21 and was advised to plan to transition to either a postop shoe or normal shoe w/ toe insole. Today, pt reports that her L foot con't to improve daily.  She reports that her foot feels achy in the mornings or after sitting for prolonged periods of time.  She has been able to transition to a tennis shoe w/ her steel insert which is a change and an improvement from the boots she was wearing previously.  Of note, pt has a hiking trip in [place] scheduled for the beginning of May.  Dx imaging: 02/11/21 L foot MRI  02/02/21 L foot XR  01/25/21 L foot XR  Pertinent review of systems: No fevers or chills  Relevant historical information: Factor V Leiden deficiency   Exam:  BP 120/82 (BP Location: Right Arm, Patient Position: Sitting, Cuff Size: Normal)   Pulse 90   Ht 5\' 5"  (1.651 m)   Wt 138 lb 3.2 oz (62.7 kg)   LMP 11/16/2014   SpO2 98%   BMI 23.00 kg/m  General: Well Developed, well nourished, and in no acute distress.   MSK: Left midfoot normal-appearing Not particularly tender to palpation Normal foot and ankle motion. Normal gait.    Lab and Radiology Results  EXAM: MRI OF THE LEFT FOOT WITHOUT CONTRAST  TECHNIQUE: Multiplanar, multisequence MR imaging of the left foot was performed. No intravenous contrast was administered.  COMPARISON:  Plain films left foot 02/02/2021 and 01/25/2021.  FINDINGS: Bones/Joint/Cartilage  There is marrow edema in the head and neck of the talus without fracture identified. Also seen is marrow edema in the cuboid with a minimally impacted fracture of the inferior margin of the  cuboid at the fourth and fifth tarsometatarsal joints. Bone marrow signal is otherwise normal.  Ligaments  The Lisfranc ligament complex is intact. There is mild edema in the interosseous segment of the ligament as seen on image 6 of series 7 consistent with sprain. The tarsometatarsal joints align normally.  Muscles and Tendons  Intact and normal in appearance.  Soft tissues  Negative.  No fluid collection or mass.  IMPRESSION: Acute or subacute bone contusion in the head and neck of the talus without fracture.  Acute or subacute small and minimally impacted fracture of the inferior aspect of the cuboid at the fourth and fifth tarsometatarsal joints.  All ligaments, including the Lisfranc ligament complex, are intact. Very mild intrasubstance increased T2 signal in the interosseous component of the Lisfranc ligament is suggestive of sprain.   Electronically Signed   By: 01/27/2021 M.D.   On: 02/11/2021 17:15  I, 04/13/2021, personally (independently) visualized and performed the interpretation of the images attached in this note.     Assessment and Plan: 55 y.o. female with left cuboid fracture.  Very subtle only picked up on MRI.  Patient clinically is improving significantly with immobilization initially with cam walker boot and now subsequently with shoe with turf toe insole.  Because of the very subtle nature of the fracture and I think that she clinically is improving with only foot immobilization I do not think she  will need the true short leg cast 6-week treatment that atypical cuboid fracture would require.  Will treat clinically with immobilization for 4 more weeks with sneaker with turf toe insole with good arch support.  After that resume normal activity starting at about 50% preinjury workload advancing 10 %/week.  This should get her back to normal activity at about the time she is planning on traveling to [place] for her vacation.  She is not  planning on doing any super vigorous hiking so I think should be okay.  Offered physical therapy referral and she declined for now.  Recheck back with me as needed.    Discussed warning signs or symptoms. Please see discharge instructions. Patient expresses understanding.   The above documentation has been reviewed and is accurate and complete Clementeen Graham, M.D.    Total encounter time 20 minutes including face-to-face time with the patient and, reviewing past medical record, and charting on the date of service.   MRI findings treatment plan and options.

## 2021-02-20 ENCOUNTER — Ambulatory Visit (INDEPENDENT_AMBULATORY_CARE_PROVIDER_SITE_OTHER): Payer: BC Managed Care – PPO | Admitting: Family Medicine

## 2021-02-20 ENCOUNTER — Other Ambulatory Visit: Payer: Self-pay

## 2021-02-20 ENCOUNTER — Encounter: Payer: Self-pay | Admitting: Family Medicine

## 2021-02-20 VITALS — BP 120/82 | HR 90 | Ht 65.0 in | Wt 138.2 lb

## 2021-02-20 DIAGNOSIS — S92215A Nondisplaced fracture of cuboid bone of left foot, initial encounter for closed fracture: Secondary | ICD-10-CM

## 2021-02-20 NOTE — Patient Instructions (Signed)
Thank you for coming in today.  I think you can continue the turf toe insole for about 4 weeks.  Resume at about 50% pre-injury workload.  After that advance activity by about 10 % per week.  We could also consider physical therapy to get you back faster.  Let me know if you want a referral.   Listen to your body.

## 2021-03-21 ENCOUNTER — Other Ambulatory Visit: Payer: Self-pay

## 2021-03-21 ENCOUNTER — Encounter: Payer: Self-pay | Admitting: Family Medicine

## 2021-03-21 ENCOUNTER — Ambulatory Visit (INDEPENDENT_AMBULATORY_CARE_PROVIDER_SITE_OTHER): Payer: BC Managed Care – PPO | Admitting: Family Medicine

## 2021-03-21 ENCOUNTER — Ambulatory Visit (INDEPENDENT_AMBULATORY_CARE_PROVIDER_SITE_OTHER): Payer: BC Managed Care – PPO

## 2021-03-21 VITALS — BP 117/82 | HR 84 | Temp 98.2°F | Ht 65.0 in | Wt 140.4 lb

## 2021-03-21 DIAGNOSIS — R739 Hyperglycemia, unspecified: Secondary | ICD-10-CM | POA: Diagnosis not present

## 2021-03-21 DIAGNOSIS — R9389 Abnormal findings on diagnostic imaging of other specified body structures: Secondary | ICD-10-CM | POA: Diagnosis not present

## 2021-03-21 DIAGNOSIS — Z0001 Encounter for general adult medical examination with abnormal findings: Secondary | ICD-10-CM

## 2021-03-21 DIAGNOSIS — L659 Nonscarring hair loss, unspecified: Secondary | ICD-10-CM

## 2021-03-21 DIAGNOSIS — E559 Vitamin D deficiency, unspecified: Secondary | ICD-10-CM

## 2021-03-21 DIAGNOSIS — Z13220 Encounter for screening for lipoid disorders: Secondary | ICD-10-CM | POA: Diagnosis not present

## 2021-03-21 DIAGNOSIS — R918 Other nonspecific abnormal finding of lung field: Secondary | ICD-10-CM | POA: Diagnosis not present

## 2021-03-21 DIAGNOSIS — M47814 Spondylosis without myelopathy or radiculopathy, thoracic region: Secondary | ICD-10-CM | POA: Diagnosis not present

## 2021-03-21 LAB — COMPREHENSIVE METABOLIC PANEL
ALT: 16 U/L (ref 0–35)
AST: 15 U/L (ref 0–37)
Albumin: 4.6 g/dL (ref 3.5–5.2)
Alkaline Phosphatase: 51 U/L (ref 39–117)
BUN: 13 mg/dL (ref 6–23)
CO2: 28 mEq/L (ref 19–32)
Calcium: 9.5 mg/dL (ref 8.4–10.5)
Chloride: 101 mEq/L (ref 96–112)
Creatinine, Ser: 0.85 mg/dL (ref 0.40–1.20)
GFR: 77.25 mL/min (ref >60.00)
Glucose, Bld: 93 mg/dL (ref 70–99)
Potassium: 4.4 mEq/L (ref 3.5–5.1)
Sodium: 137 mEq/L (ref 135–145)
Total Bilirubin: 0.6 mg/dL (ref 0.2–1.2)
Total Protein: 7.2 g/dL (ref 6.0–8.3)

## 2021-03-21 LAB — TSH: TSH: 3.1 u[IU]/mL (ref 0.35–4.50)

## 2021-03-21 LAB — VITAMIN D 25 HYDROXY (VIT D DEFICIENCY, FRACTURES): VITD: 64.16 ng/mL (ref 30.00–100.00)

## 2021-03-21 LAB — VITAMIN B12: Vitamin B-12: >1506 pg/mL — ABNORMAL HIGH (ref 211–911)

## 2021-03-21 LAB — LIPID PANEL
Cholesterol: 240 mg/dL — ABNORMAL HIGH (ref 0–200)
HDL: 80.9 mg/dL (ref >39.00)
LDL Cholesterol: 142 mg/dL — ABNORMAL HIGH (ref 0–99)
NonHDL: 159.08
Total CHOL/HDL Ratio: 3
Triglycerides: 87 mg/dL (ref 0.0–149.0)
VLDL: 17.4 mg/dL (ref 0.0–40.0)

## 2021-03-21 LAB — CBC
HCT: 40.2 % (ref 36.0–46.0)
Hemoglobin: 13.4 g/dL (ref 12.0–15.0)
MCHC: 33.4 g/dL (ref 30.0–36.0)
MCV: 90.2 fl (ref 78.0–100.0)
Platelets: 263 10*3/uL (ref 150.0–400.0)
RBC: 4.45 Mil/uL (ref 3.87–5.11)
RDW: 13.1 % (ref 11.5–15.5)
WBC: 3.9 10*3/uL — ABNORMAL LOW (ref 4.0–10.5)

## 2021-03-21 LAB — IBC + FERRITIN
Ferritin: 86.2 ng/mL (ref 10.0–291.0)
Iron: 111 ug/dL (ref 42–145)
Saturation Ratios: 36.4 % (ref 20.0–50.0)
Transferrin: 218 mg/dL (ref 212.0–360.0)

## 2021-03-21 LAB — FOLATE: Folate: >23.6 ng/mL (ref >5.9)

## 2021-03-21 LAB — HEMOGLOBIN A1C: Hgb A1c MFr Bld: 5.2 % (ref 4.6–6.5)

## 2021-03-21 NOTE — Assessment & Plan Note (Signed)
We will check chest x-ray as recommended by pulmonology.  Has a history of abnormal chest CT in 2009.  Follow-up with pulmonology who recommended every other year chest x-ray going forward.

## 2021-03-21 NOTE — Patient Instructions (Signed)
It was very nice to see you today!  We will check an x-ray and blood work today.  Please check with your insurance about your shingles vaccine.  I will see you back in a year.  Please come back to see me sooner if needed.  Take care, Dr Jerline Pain  PLEASE NOTE:  If you had any lab tests please let us know if you have not heard back within a few days. You may see your results on mychart before we have a chance to review them but we will give you a call once they are reviewed by [place]. If we ordered any referrals today, please let us know if you have not heard from their office within the next week.   Please try these tips to maintain a healthy lifestyle:   Eat at least 3 REAL meals and 1-2 snacks per day.  Aim for no more than 5 hours between eating.  If you eat breakfast, please do so within one hour of getting up.    Each meal should contain half fruits/vegetables, one quarter protein, and one quarter carbs (no bigger than a computer mouse)   Cut down on sweet beverages. This includes juice, soda, and sweet tea.     Drink at least 1 glass of water with each meal and aim for at least 8 glasses per day   Exercise at least 150 minutes every week.    Preventive Care 24-45 Years Old, Female Preventive care refers to lifestyle choices and visits with your health care provider that can promote health and wellness. This includes:  A yearly physical exam. This is also called an annual wellness visit.  Regular dental and eye exams.  Immunizations.  Screening for certain conditions.  Healthy lifestyle choices, such as: ? Eating a healthy diet. ? Getting regular exercise. ? Not using drugs or products that contain nicotine and tobacco. ? Limiting alcohol use. What can I expect for my preventive care visit? Physical exam Your health care provider will check your:  Height and weight. These may be used to calculate your BMI (body mass index). BMI is a measurement that tells if you  are at a healthy weight.  Heart rate and blood pressure.  Body temperature.  Skin for abnormal spots. Counseling Your health care provider may ask you questions about your:  Past medical problems.  Family's medical history.  Alcohol, tobacco, and drug use.  Emotional well-being.  Home life and relationship well-being.  Sexual activity.  Diet, exercise, and sleep habits.  Work and work Statistician.  Access to firearms.  Method of birth control.  Menstrual cycle.  Pregnancy history. What immunizations do I need? Vaccines are usually given at various ages, according to a schedule. Your health care provider will recommend vaccines for you based on your age, medical history, and lifestyle or other factors, such as travel or where you work.   What tests do I need? Blood tests  Lipid and cholesterol levels. These may be checked every 5 years, or more often if you are over 47 years old.  Hepatitis C test.  Hepatitis B test. Screening  Lung cancer screening. You may have this screening every year starting at age 26 if you have a 30-pack-year history of smoking and currently smoke or have quit within the past 15 years.  Colorectal cancer screening. ? All adults should have this screening starting at age 58 and continuing until age 30. ? Your health care provider may recommend screening at age 24  if you are at increased risk. ? You will have tests every 1-10 years, depending on your results and the type of screening test.  Diabetes screening. ? This is done by checking your blood sugar (glucose) after you have not eaten for a while (fasting). ? You may have this done every 1-3 years.  Mammogram. ? This may be done every 1-2 years. ? Talk with your health care provider about when you should start having regular mammograms. This may depend on whether you have a family history of breast cancer.  BRCA-related cancer screening. This may be done if you have a family history  of breast, ovarian, tubal, or peritoneal cancers.  Pelvic exam and Pap test. ? This may be done every 3 years starting at age 61. ? Starting at age 82, this may be done every 5 years if you have a Pap test in combination with an HPV test. Other tests  STD (sexually transmitted disease) testing, if you are at risk.  Bone density scan. This is done to screen for osteoporosis. You may have this scan if you are at high risk for osteoporosis. Talk with your health care provider about your test results, treatment options, and if necessary, the need for more tests. Follow these instructions at home: Eating and drinking  Eat a diet that includes fresh fruits and vegetables, whole grains, lean protein, and low-fat dairy products.  Take vitamin and mineral supplements as recommended by your health care provider.  Do not drink alcohol if: ? Your health care provider tells you not to drink. ? You are pregnant, may be pregnant, or are planning to become pregnant.  If you drink alcohol: ? Limit how much you have to 0-1 drink a day. ? Be aware of how much alcohol is in your drink. In the [place] one drink equals one 12 oz bottle of beer (355 mL), one 5 oz glass of wine (148 mL), or one 1 oz glass of hard liquor (44 mL).   Lifestyle  Take daily care of your teeth and gums. Brush your teeth every morning and night with fluoride toothpaste. Floss one time each day.  Stay active. Exercise for at least 30 minutes 5 or more days each week.  Do not use any products that contain nicotine or tobacco, such as cigarettes, e-cigarettes, and chewing tobacco. If you need help quitting, ask your health care provider.  Do not use drugs.  If you are sexually active, practice safe sex. Use a condom or other form of protection to prevent STIs (sexually transmitted infections).  If you do not wish to become pregnant, use a form of birth control. If you plan to become pregnant, see your health care provider for a  prepregnancy visit.  If told by your health care provider, take low-dose aspirin daily starting at age 73.  Find healthy ways to cope with stress, such as: ? Meditation, yoga, or listening to music. ? Journaling. ? Talking to a trusted person. ? Spending time with friends and family. Safety  Always wear your seat belt while driving or riding in a vehicle.  Do not drive: ? If you have been drinking alcohol. Do not ride with someone who has been drinking. ? When you are tired or distracted. ? While texting.  Wear a helmet and other protective equipment during sports activities.  If you have firearms in your house, make sure you follow all gun safety procedures. What's next?  Visit your health care provider once a year  for an annual wellness visit.  Ask your health care provider how often you should have your eyes and teeth checked.  Stay up to date on all vaccines. This information is not intended to replace advice given to you by your health care provider. Make sure you discuss any questions you have with your health care provider. Document Revised: 08/30/2020 Document Reviewed: 08/07/2018 Elsevier Patient Education  2021 [employer].

## 2021-03-21 NOTE — Progress Notes (Signed)
Chief Complaint:  Caroline Rasmussen is a 55 y.o. female who presents today for her annual comprehensive physical exam.    Assessment/Plan:  Chronic Problems Addressed Today: Abnormal chest CT We will check chest x-ray as recommended by pulmonology.  Has a history of abnormal chest CT in 2009.  Follow-up with pulmonology who recommended every other year chest x-ray going forward.  Hyperglycemia Check A1c.  Alopecia Follow-up with dermatology.  Will check labs today including iron panel, TSH, CBC, folate, and B12.  Preventative Healthcare: Check labs.  Up-to-date on Pap and mammogram via gynecology.  Advised her to check with insurance regarding shingles vaccine.  Up-to-date on colonoscopy.  Patient Counseling(The following topics were reviewed and/or handout was given):  -Nutrition: Stressed importance of moderation in sodium/caffeine intake, saturated fat and cholesterol, caloric balance, sufficient intake of fresh fruits, vegetables, and fiber.  -Stressed the importance of regular exercise.   -Substance Abuse: Discussed cessation/primary prevention of tobacco, alcohol, or other drug use; driving or other dangerous activities under the influence; availability of treatment for abuse.   -Injury prevention: Discussed safety belts, safety helmets, smoke detector, smoking near bedding or upholstery.   -Sexuality: Discussed sexually transmitted diseases, partner selection, use of condoms, avoidance of unintended pregnancy and contraceptive alternatives.   -Dental health: Discussed importance of regular tooth brushing, flossing, and dental visits.  -Health maintenance and immunizations reviewed. Please refer to Health maintenance section.  Return to care in 1 year for next preventative visit.     Subjective:  HPI:  She has no acute complaints today.  See A/P for status of chronic conditions.  Lifestyle Diet: Balanced. Plenty of fruits and vegetables.  Exercise: Walking 3 miles per  day. Limited due to orthopedic issues.   Depression screen PHQ 2/9 03/21/2021  Decreased Interest 0  Down, Depressed, Hopeless 0  PHQ - 2 Score 0    Health Maintenance Due  Topic Date Due  . Hepatitis C Screening  Never done  . HIV Screening  Never done  . PAP SMEAR-Modifier  04/09/2016  . MAMMOGRAM  10/01/2017     ROS: Per HPI, otherwise a complete review of systems was negative.   PMH:  The following were reviewed and entered/updated in epic: Past Medical History:  Diagnosis Date  . Dupuytren contracture   . GERD (gastroesophageal reflux disease)   . IBS (irritable bowel syndrome)    Patient Active Problem List   Diagnosis Date Noted  . Abnormal chest CT 03/21/2021  . Midline low back pain 12/09/2018  . Short leg syndrome, right, acquired 12/09/2018  . Achilles tendon pain 07/23/2017  . ADD (attention deficit disorder) 12/21/2015  . 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 IBS 10/25/2008   Past Surgical History:  Procedure Laterality Date  . BREAST BIOPSY     benign  . BUNIONECTOMY    . COLONOSCOPY    . DILATION AND CURETTAGE OF UTERUS     x2 (miscarriages)    Family History  Problem Relation Age of Onset  . Hypertension Mother   . Hyperlipidemia Mother   . Diabetes Father   . Hyperlipidemia Father   . Hypertension Father   . Colon cancer Neg Hx   . Esophageal cancer Neg Hx   . Rectal cancer Neg Hx   . Stomach cancer Neg Hx     Medications- reviewed and updated Current Outpatient Medications  Medication Sig Dispense Refill  . Ascorbic Acid (VITAMIN C) 1000 MG tablet Take 1,000 mg by mouth daily.    Marland Kitchen  aspirin 81 MG tablet Take 81 mg by mouth daily.    . Calcium Carbonate-Vitamin D 600-200 MG-UNIT TABS Take 1 tablet by mouth daily.    . fish oil-omega-3 fatty acids 1000 MG capsule Take 2 g by mouth daily.    . folic acid (FOLVITE) 400 MCG tablet Take 400 mcg by mouth daily.    . Multiple Vitamin (MULTIVITAMIN) tablet Take 1 tablet by mouth daily.     . Turmeric (QC TUMERIC COMPLEX PO) Take by mouth.    . vitamin B-12 (CYANOCOBALAMIN) 500 MCG tablet Take 500 mcg by mouth daily.     No current facility-administered medications for this visit.    Allergies-reviewed and updated Allergies  Allergen Reactions  . Erythromycin Base     REACTION: vomiting  . Oxymorphone Rash    Social History   Socioeconomic History  . Marital status: Married    Spouse name: Not on file  . Number of children: Not on file  . Years of education: Not on file  . Highest education level: Not on file  Occupational History  . Not on file  Tobacco Use  . Smoking status: Never Smoker  . Smokeless tobacco: Never Used  Vaping Use  . Vaping Use: Never used  Substance and Sexual Activity  . Alcohol use: Yes    Alcohol/week: 1.0 standard drink    Types: 1 Glasses of wine per week  . Drug use: No  . Sexual activity: Not on file  Other Topics Concern  . Not on file  Social History Narrative  . Not on file   Social Determinants of Health   Financial Resource Strain: Not on file  Food Insecurity: Not on file  Transportation Needs: Not on file  Physical Activity: Not on file  Stress: Not on file  Social Connections: Not on file        Objective:  Physical Exam: BP 117/82   Pulse 84   Temp 98.2 F (36.8 C) (Temporal)   Ht 5\' 5"  (1.651 m)   Wt 140 lb 6.4 oz (63.7 kg)   LMP 11/16/2014   SpO2 98%   BMI 23.36 kg/m   Body mass index is 23.36 kg/m. Wt Readings from Last 3 Encounters:  03/21/21 140 lb 6.4 oz (63.7 kg)  02/20/21 138 lb 3.2 oz (62.7 kg)  02/02/21 140 lb 12.8 oz (63.9 kg)   Gen: NAD, resting comfortably HEENT: TMs normal bilaterally. OP clear. No thyromegaly noted.  CV: RRR with no murmurs appreciated Pulm: NWOB, CTAB with no crackles, wheezes, or rhonchi GI: Normal bowel sounds present. Soft, Nontender, Nondistended. MSK: no edema, cyanosis, or clubbing noted Skin: warm, dry Neuro: CN2-12 grossly intact. Strength 5/5 in  upper and lower extremities. Reflexes symmetric and intact bilaterally.  Psych: Normal affect and thought content     Caroline Braud M. 02/04/21, MD 03/21/2021 9:57 AM

## 2021-03-22 NOTE — Progress Notes (Signed)
Please inform patient of the following:  Her chest xray is stable.  Caroline Rasmussen. Jimmey Ralph, MD 03/22/2021 8:52 AM

## 2021-03-23 ENCOUNTER — Encounter: Payer: Self-pay | Admitting: Family Medicine

## 2021-03-23 NOTE — Progress Notes (Signed)
Please inform patient of the following:  Cholesterol elevated.  All of her other labs are stable.  Not to make any changes to her medications at this time.  We can recheck in a year.

## 2021-03-24 ENCOUNTER — Encounter: Payer: Self-pay | Admitting: Family Medicine

## 2021-03-27 ENCOUNTER — Telehealth: Payer: Self-pay

## 2021-03-27 ENCOUNTER — Encounter: Payer: Self-pay | Admitting: Family Medicine

## 2021-03-27 DIAGNOSIS — E785 Hyperlipidemia, unspecified: Secondary | ICD-10-CM | POA: Insufficient documentation

## 2021-03-27 NOTE — Progress Notes (Signed)
Ok to send in lipitor 20mg  daily if she wishes to start a medication.  We can recheck in 6-12 months.  8-12. Katina Degree, MD 03/27/2021 3:35 PM

## 2021-03-27 NOTE — Telephone Encounter (Signed)
Patient called concerning Lab results message sent to PCP

## 2021-03-27 NOTE — Telephone Encounter (Signed)
Patient is returning a call from Khamika.  

## 2021-03-28 MED ORDER — ATORVASTATIN CALCIUM 20 MG PO TABS: 20.0000 mg | ORAL_TABLET | Freq: Every day | ORAL | 1 refills | Status: AC

## 2021-03-28 NOTE — Telephone Encounter (Signed)
Pt aware of result and Rx sent to pharmacy.

## 2021-03-29 ENCOUNTER — Encounter: Payer: Self-pay | Admitting: Family Medicine

## 2021-03-30 DIAGNOSIS — H5500 Unspecified nystagmus: Secondary | ICD-10-CM | POA: Diagnosis not present

## 2021-03-30 DIAGNOSIS — H53001 Unspecified amblyopia, right eye: Secondary | ICD-10-CM | POA: Diagnosis not present

## 2021-03-30 DIAGNOSIS — H5213 Myopia, bilateral: Secondary | ICD-10-CM | POA: Diagnosis not present

## 2021-03-30 DIAGNOSIS — H532 Diplopia: Secondary | ICD-10-CM | POA: Diagnosis not present

## 2021-09-01 DIAGNOSIS — N941 Unspecified dyspareunia: Secondary | ICD-10-CM | POA: Diagnosis not present

## 2021-09-01 DIAGNOSIS — Z01419 Encounter for gynecological examination (general) (routine) without abnormal findings: Secondary | ICD-10-CM | POA: Diagnosis not present

## 2021-09-01 DIAGNOSIS — Z8262 Family history of osteoporosis: Secondary | ICD-10-CM | POA: Diagnosis not present

## 2021-09-01 DIAGNOSIS — Z6823 Body mass index (BMI) 23.0-23.9, adult: Secondary | ICD-10-CM | POA: Diagnosis not present

## 2021-09-28 DIAGNOSIS — Z780 Asymptomatic menopausal state: Secondary | ICD-10-CM | POA: Diagnosis not present

## 2021-09-28 DIAGNOSIS — M85852 Other specified disorders of bone density and structure, left thigh: Secondary | ICD-10-CM | POA: Diagnosis not present

## 2021-12-15 DIAGNOSIS — Z1231 Encounter for screening mammogram for malignant neoplasm of breast: Secondary | ICD-10-CM | POA: Diagnosis not present

## 2021-12-22 DIAGNOSIS — Z6823 Body mass index (BMI) 23.0-23.9, adult: Secondary | ICD-10-CM | POA: Diagnosis not present

## 2021-12-22 DIAGNOSIS — R9389 Abnormal findings on diagnostic imaging of other specified body structures: Secondary | ICD-10-CM | POA: Diagnosis not present

## 2021-12-22 DIAGNOSIS — E785 Hyperlipidemia, unspecified: Secondary | ICD-10-CM | POA: Diagnosis not present

## 2021-12-22 DIAGNOSIS — Z23 Encounter for immunization: Secondary | ICD-10-CM | POA: Diagnosis not present

## 2021-12-25 DIAGNOSIS — R922 Inconclusive mammogram: Secondary | ICD-10-CM | POA: Diagnosis not present

## 2022-03-30 DIAGNOSIS — Z1159 Encounter for screening for other viral diseases: Secondary | ICD-10-CM | POA: Diagnosis not present

## 2022-03-30 DIAGNOSIS — E785 Hyperlipidemia, unspecified: Secondary | ICD-10-CM | POA: Diagnosis not present

## 2022-03-30 DIAGNOSIS — M5137 Other intervertebral disc degeneration, lumbosacral region: Secondary | ICD-10-CM | POA: Diagnosis not present

## 2022-03-30 DIAGNOSIS — D682 Hereditary deficiency of other clotting factors: Secondary | ICD-10-CM | POA: Diagnosis not present

## 2022-03-30 DIAGNOSIS — Z136 Encounter for screening for cardiovascular disorders: Secondary | ICD-10-CM | POA: Diagnosis not present

## 2022-03-30 DIAGNOSIS — M25512 Pain in left shoulder: Secondary | ICD-10-CM | POA: Diagnosis not present

## 2022-03-30 DIAGNOSIS — Z130 Encounter for screening for diseases of the blood and blood-forming organs and certain disorders involving the immune mechanism: Secondary | ICD-10-CM | POA: Diagnosis not present

## 2022-03-30 DIAGNOSIS — Z0000 Encounter for general adult medical examination without abnormal findings: Secondary | ICD-10-CM | POA: Diagnosis not present

## 2022-03-30 DIAGNOSIS — R9389 Abnormal findings on diagnostic imaging of other specified body structures: Secondary | ICD-10-CM | POA: Diagnosis not present

## 2022-03-30 DIAGNOSIS — Z131 Encounter for screening for diabetes mellitus: Secondary | ICD-10-CM | POA: Diagnosis not present

## 2022-04-30 DIAGNOSIS — D225 Melanocytic nevi of trunk: Secondary | ICD-10-CM | POA: Diagnosis not present

## 2022-04-30 DIAGNOSIS — L821 Other seborrheic keratosis: Secondary | ICD-10-CM | POA: Diagnosis not present

## 2022-04-30 DIAGNOSIS — L218 Other seborrheic dermatitis: Secondary | ICD-10-CM | POA: Diagnosis not present

## 2022-09-03 ENCOUNTER — Encounter: Payer: Self-pay | Admitting: *Deleted

## 2022-11-22 ENCOUNTER — Encounter: Payer: Self-pay | Admitting: *Deleted
